# Patient Record
Sex: Female | Born: 1965 | Race: White | Hispanic: No | Marital: Married
Health system: Midwestern US, Academic
[De-identification: ages and names within clinical notes are randomized; demographics above are authoritative.]

---

## 2016-12-11 ENCOUNTER — Encounter: Admit: 2016-12-11 | Discharge: 2016-12-12 | Payer: BC Managed Care – PPO

## 2016-12-16 ENCOUNTER — Encounter: Admit: 2016-12-16 | Discharge: 2016-12-17 | Payer: BC Managed Care – PPO

## 2016-12-16 DIAGNOSIS — R69 Illness, unspecified: Principal | ICD-10-CM

## 2016-12-18 ENCOUNTER — Encounter: Admit: 2016-12-18 | Discharge: 2016-12-19 | Payer: BC Managed Care – PPO

## 2016-12-18 DIAGNOSIS — R69 Illness, unspecified: Principal | ICD-10-CM

## 2016-12-31 ENCOUNTER — Encounter: Admit: 2016-12-31 | Discharge: 2016-12-31 | Payer: BC Managed Care – PPO

## 2017-01-02 ENCOUNTER — Encounter: Admit: 2017-01-02 | Discharge: 2017-01-02 | Payer: BC Managed Care – PPO

## 2017-01-02 DIAGNOSIS — R69 Illness, unspecified: Principal | ICD-10-CM

## 2017-01-06 ENCOUNTER — Encounter: Admit: 2017-01-06 | Discharge: 2017-01-06 | Payer: BC Managed Care – PPO

## 2017-01-07 ENCOUNTER — Encounter: Admit: 2017-01-07 | Discharge: 2017-01-07 | Payer: BC Managed Care – PPO

## 2017-01-07 DIAGNOSIS — N6310 Unspecified lump in the right breast, unspecified quadrant: Principal | ICD-10-CM

## 2017-01-08 ENCOUNTER — Encounter: Admit: 2017-01-08 | Discharge: 2017-01-08 | Payer: BC Managed Care – PPO

## 2017-01-08 NOTE — Telephone Encounter
Navigation Intake Assessment    Patient Name:  Jacolyn Joaquin  DOB: 1965-08-19  Insurance:  Ezequiel Essex  Direct Referral: None  Appointment Info:    Future Appointments  Date Time Provider Department Center   01/12/2017 2:00 PM Dimas Alexandria, Cordelia Poche [place] [facility] Exam   01/12/2017 3:30 PM DIAGNOSTIC TOMO ROOM [facility] Radiology   01/12/2017 4:00 PM BREAST SONO ROOM 2 [facility] Radiology       Diagnosis & Reason for Visit:  Right breast mass, Pre-biopsy workup      Physician Info:   ? Referring Physician:  Rockwell Germany, PA  ??? Contact Name & Number:  Phone: [phone]  ??? Surgeon:  N/A  ??? Medical Oncologist:  N/A  ??? Radiation Oncologist:  N/A  ??? PCP:  Melissa Huntington, PA    Location of Films:   PACS    Location of Pathology:  No pathology slides requested.     History of Present Illness: Patient had a bilateral screening mammogram [facility]) on 12/11/16. Right breast focal asymmetry was discovered on imaging and additional imaging was recommended (BI-RADS 0). Right diagnostic mammogram [facility]) was done on 12/16/16 and persistent nodular fibroglandular tissue was seen on imaging (BI-RADS 0). Right breast ultrasound [facility]) on 12/16/16 revealed a vague masslike appearance (BI-RADS 4A). Patient had a right breast ultrasound-guided biopsy [facility]) on 12/18/16. Pathology revealed normal breast parenchyma and no definitive lesional tissue identified. No tissue was seen to explain the abnormal imaging findings. Clinical and radiologic correlation and follow-up were recommended. Patient was referred to [facility] for further evaluation and treatment. Patient denies pain, skin changes or nipple discharge on either breast. Patient denies any other history of breast issues, surgeries or biopsies. Outside images and reports were sent to [facility] Breast Radiology. Recommendations were given on 01/07/17 for patient to have a right diagnostic mammogram and right breast ultrasound. Patient was notified and scheduled.     Family History of Breast Cancer:   Patient denies a family history of breast, ovarian or prostate cancer.     Personal History of Other Cancers: None

## 2017-01-09 ENCOUNTER — Encounter: Admit: 2017-01-09 | Discharge: 2017-01-09 | Payer: BC Managed Care – PPO

## 2017-01-09 DIAGNOSIS — J45909 Unspecified asthma, uncomplicated: ICD-10-CM

## 2017-01-09 DIAGNOSIS — M1990 Unspecified osteoarthritis, unspecified site: ICD-10-CM

## 2017-01-09 DIAGNOSIS — F419 Anxiety disorder, unspecified: ICD-10-CM

## 2017-01-09 DIAGNOSIS — G43909 Migraine, unspecified, not intractable, without status migrainosus: Principal | ICD-10-CM

## 2017-01-09 DIAGNOSIS — D259 Leiomyoma of uterus, unspecified: ICD-10-CM

## 2017-01-09 DIAGNOSIS — K219 Gastro-esophageal reflux disease without esophagitis: ICD-10-CM

## 2017-01-12 ENCOUNTER — Encounter: Admit: 2017-01-12 | Discharge: 2017-01-12 | Payer: BC Managed Care – PPO

## 2017-01-12 ENCOUNTER — Encounter: Admit: 2017-01-12 | Discharge: 2017-01-13 | Payer: BC Managed Care – PPO

## 2017-01-12 DIAGNOSIS — J45909 Unspecified asthma, uncomplicated: ICD-10-CM

## 2017-01-12 DIAGNOSIS — G43909 Migraine, unspecified, not intractable, without status migrainosus: Principal | ICD-10-CM

## 2017-01-12 DIAGNOSIS — F419 Anxiety disorder, unspecified: ICD-10-CM

## 2017-01-12 DIAGNOSIS — N6310 Unspecified lump in the right breast, unspecified quadrant: Principal | ICD-10-CM

## 2017-01-12 DIAGNOSIS — D259 Leiomyoma of uterus, unspecified: ICD-10-CM

## 2017-01-12 DIAGNOSIS — K219 Gastro-esophageal reflux disease without esophagitis: ICD-10-CM

## 2017-01-12 DIAGNOSIS — M1990 Unspecified osteoarthritis, unspecified site: ICD-10-CM

## 2017-01-12 NOTE — Progress Notes
Name: Alicia Padilla          MRN: [id number]      DOB: 04/17/1966      AGE: 51 y.o.   DATE OF SERVICE: 01/12/2017               DIAGNOSIS:  Right breast mass     History of Present Illness    Alicia Padilla is a caucasian female who presented to the [facility] on 01/12/2017 at age 59 for evaluation of right breast mass. Alicia Padilla had no complaints prior to her screening mammogram. A new right breast asymmetry was seen. There was a probably correlate on ultrasound and biopsy was recommended. Right breast sono-guided biopsy 12/18/16 ([facility]) revealed benign breast tissue, no definitive lesional tissue was present. There was question as to if the area targeted for biopsy was sampled and Alicia Padilla was referred to [facility] for further evaluation.    BREAST IMAGING:  Mammogram:    -- Bilateral screening mammogram 12/11/16 [facility]) revealed scattered fibroglandular densities. There was a focal asymmetry in the right breast at 3:00 at posterior depth. No suspicious calcification in either breast. No asymmetry or mass in the left breast.  -- Right diagnostic mammogram 12/16/16 [facility]) revealed in the upper outer right breast there was a persistent nodular fibroglandular tissue with spot compression there was no evidence of suspicious calcifications.    Ultrasound:    -- Right breast ultrasound 12/16/16 [facility]) revealed in the area of mammographic concern at 9:00 there was an area of somewhat ill-defined heterogeneous echogenicity which had a vague masslike appearance. There was no definite evidence of axillary adenopathy. BI-RADS 4.    REPRODUCTIVE HEALTH:  Age at first Menarche:  50  Age at First Live Birth:  57  Age at Menopause:  Hysterectomy at 22, still has ovaries  Gravida:  3  Para: 3  Breastfeeding:  No    PERTINENT PMH:  Asthma  FAMILY HISTORY: No family history of breast, ovarian, or prostate cancer   PHYSICAL EXAM on PRESENTATION:  Right - No palpable breast masses. No skin, nipple, or areolar change. Left - No palpable breast masses. No skin, nipple, or areolar change. No supraclavicular or axillary adenopathy.  REFERRED BY:  Melissa Huntington, PA       Review of Systems    Constitutional: Negative for fever, chills, appetite change and fatigue.   HENT: Negative for hearing loss, congestion, rhinorrhea and tinnitus.    Eyes: Negative for pain, discharge and itching.   Respiratory: Negative for cough, chest tightness and shortness of breath.    Cardiovascular: Negative for chest pain and palpitations.   Gastrointestinal: Negative for abdominal distention, pain, vomiting. nausea and diarrhea.   Genitourinary: Negative for frequency, vaginal bleeding, difficulty urinating and pelvic pain.   Musculoskeletal: Negative for myalgias, back pain, joint swelling and arthralgias.   Skin: Negative for rash.   Neurological: Negative for dizziness, weakness, light-headedness. headaches.   Hematological: Does not bruise/bleed easily.   Psychiatric/Behavioral: Negative for disturbed wake/sleep cycle. The patient is not nervous/anxious.    No Known Allergies    The following medical/surgical/family/social history and the list of medications are current, as of 01/12/2017    Past Medical History:   Diagnosis Date   ??? Anxiety    ??? Asthma    ??? DJD (degenerative joint disease)    ??? GERD (gastroesophageal reflux disease)    ??? Migraines    ??? Uterine fibroid  Past Surgical History:   Procedure Laterality Date   ??? HYSTERECTOMY  08/2011   ??? TUBAL LIGATION     ??? WISDOM TEETH EXTRACTION       Family History   Problem Relation Age of Onset   ??? Cancer-Lung Mother    ??? Cancer Mother         brain   ??? Osteoporosis Mother    ??? Heart Attack Father         x 2   ??? Diabetes Father    ??? Cancer-Colon Paternal Grandfather    ??? Cancer Paternal Grandfather         stomach     Social History     Social History   ??? Marital status: Married     Spouse name: N/A   ??? Number of children: N/A   ??? Years of education: N/A Social History Main Topics   ??? Smoking status: Never Smoker   ??? Smokeless tobacco: Never Used   ??? Alcohol use Yes   ??? Drug use: No   ??? Sexual activity: Yes     Birth control/ protection: Surgical     Other Topics Concern   ??? Not on file     Social History Narrative   ??? No narrative on file         Objective:         ??? atorvastatin (LIPITOR) 10 mg tablet Take 10 mg by mouth daily.   ??? Azelastine 0.15 % (205.5 mcg) spry Apply  to each nostril as directed.   ??? hydroCHLOROthiazide (HYDRODIURIL) 50 mg tablet Take 50 mg by mouth every morning.   ??? omeprazole DR(+) (PRILOSEC) 20 mg capsule Take 20 mg by mouth daily before breakfast.   ??? SUMAtriptan succinate (IMITREX) 50 mg tablet Take 50 mg by mouth as Needed for Migraine symptoms. Dose may be repeated in 2 hours if needed. Max of 2 tablets in 24 hours.   ??? verapamil (ISOPTIN) 120 mg tablet Take 120 mg by mouth three times daily.     Vitals:    01/12/17 1402   BP: 130/90   Pulse: 87   Resp: 17   Temp: 36.8 ???C (98.3 ???F)   TempSrc: Oral   SpO2: 100%   Weight: 80.1 kg (176 lb 9.6 oz)   Height: 162.6 cm (64)     Body mass index is 30.31 kg/m???.     Pain Score: Zero         Pain Addressed:  N/A    Patient Evaluated for a Clinical Trial: No treatment clinical trial available for this patient.     [place] Cooperative Oncology Group performance status is Not applicable. Marland Kitchen     Physical Exam   Vitals reviewed.  RIGHT BREAST EXAM:  Breast:  No palpable masses  Skin Erythema:  No  Attachment of Overlying Skin:  No  Peau d' orange:  No  Chest Wall Attachment:  No  Nipple Inversion:  No  Nipple Discharge: No    LEFT BREAST EXAM:  Breast: No palpable masses  Skin Erythema:  No  Attachment of Overlying Skin:  No  Peau d' orange:  No  Chest Wall Attachment: No  Nipple Inversion:  No  Nipple Discharge:  No    RIGHT NODAL BASIN EXAM:  Axillary:  negative  Infraclavicular:  negative  Supraclavicular:  negative    LEFT NODAL BASIN EXAM:  Axillary:  negative  Infraclavicular: negative Supraclavicular:  negative    Constitutional: Well-developed  and well-nourished. No acute distress.  HEENT:  Head: Normocephalic and atraumatic.  Eyes: Pupils are equal, round and reactive to light. No discharge. No scleral icterus.  Cardiovascular: Normal rate, regular rhythm and normal heart sounds. No murmur or gallop.  Pulmonary/Chest: Effort normal and breath sounds normal. No respiratory distress. No wheezes. No rales.   Musculoskeletal: Normal range of motion. No edema.  Neurological: Alert and oriented to person, place and time. No cranial nerve deficit.  Skin: Warm and dry. No rash noted. No erythema. No pallor.  Psychiatric: Normal mood and affect. Behavior is normal. Judgement and thought content normal.              Assessment and Plan:  Right breast mass    Ms. Cloutier had an outside biopsy that was thought to be possibly discordant. She is scheduled for repeat imaging today. 3 outcomes from the repeat imaging 1) No further follow up imaging recommended, 2) Follow up imaging in 3-6 months, or 3) Biopsy. I will call Ms. Skora with her imaging results. She was given ample time to ask questions all of which were answered to her satisfaction.    1. Right diagnostic mammogram and ultrasound  2. RTC pending imaging    Dimas Alexandria, PA-C    ADDENDUM 01/12/17:  I spoke with Ms. Bendixen regarding her imaging (the mammogram was cancelled). 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 It was felt to be present on imaging since 2013. The pathology is felt to be concordant with the imaging. She may return to screening mammogram in June 2019.    US BREAST TARGET RT: RIGHT BREAST - January 12, 2017 -   ACCESSION #: [id number]  Technologist: Julianne Rice, Sonographer  Prior study comparison: December 18, 2016, mammogram. ???December 16, 2016,   mammogram. ???December 16, 2016, right breast ultrasound. ???December 11, 2016, mammogram. ???June 26, 2015, mammogram. ???March 17, 2014, mammogram. ???September 15, 2012, mammogram. ???June 20, 2011,   mammogram.    History: 51 year old female presents for second opinion of a   recently biopsied right breast mass    Findings: High-resolution targeted sonographic imaging was   performed of the right breast for further evaluation of a   recently biopsied right breast mass. At 9:00, 8 cm from the   nipple, there is no significant change in a circumscribed mass   which contains normal fat and fibroglandular breast tissue and   measures 4.8 x 2.0 cm. This corresponds to a mass with similar   features on mammography which has been stable since at least   06/20/2011, most consistent with a benign hamartoma. This   correlates with the pathologic findings of normal breast   parenchyma without definitive lesional tissue identified.    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 No additional targeted follow-up is required. This is felt   to be concordant with the pathology findings of benign normal   breast parenchyma. The patient should continue routine annual   screening mammograms.      Finalized by Damian Leavell, M.D. on 01/12/2017 3:52 PM. Dictated   by Damian Leavell, M.D. on 01/12/2017 3:47 PM.      Electronically signed and approved by: Damian Leavell, M.D.   [id number]  IMPRESSION    ACR BI-RADS??? Assessments: BIRAD 2-Benign        RECOMMENDATION:  Routine screening mammogram.  Meleana Commerford Miller, PA-C

## 2019-01-31 IMAGING — MG MAMMOGRAM 3D SCREEN, BILATERAL
12 series · 12 of 12 positions shown · non-contrast
Comparison: none

[R tomo (1 of 2)]
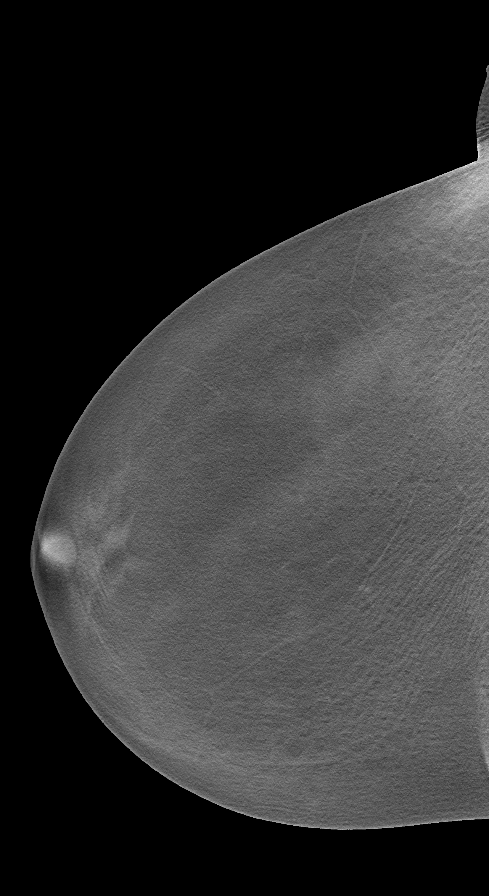

[R CC]
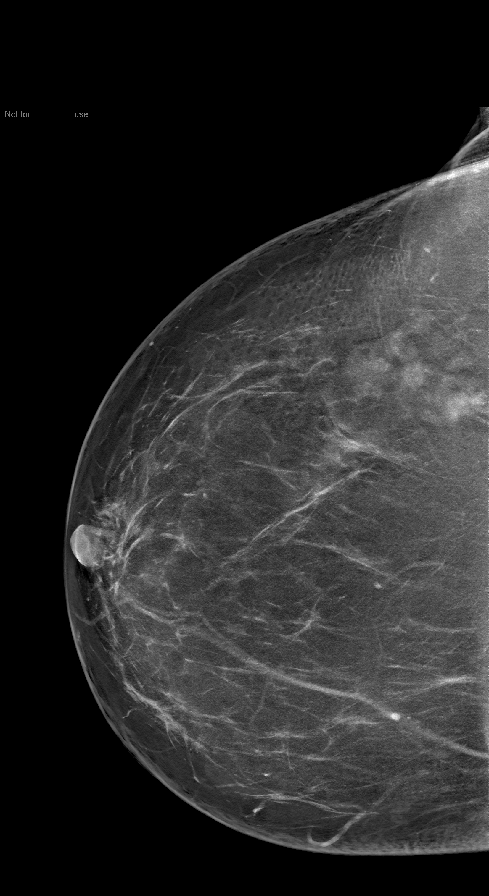

[R (1 of 2)]
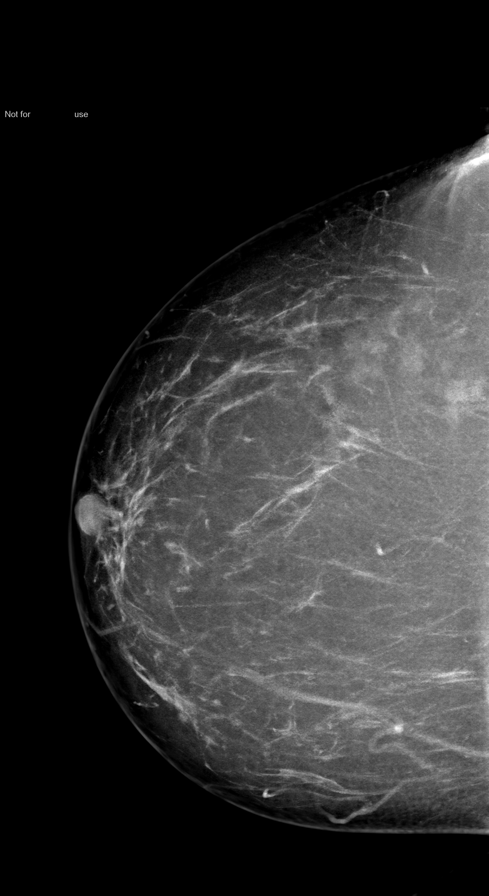

[L tomo (1 of 2)]
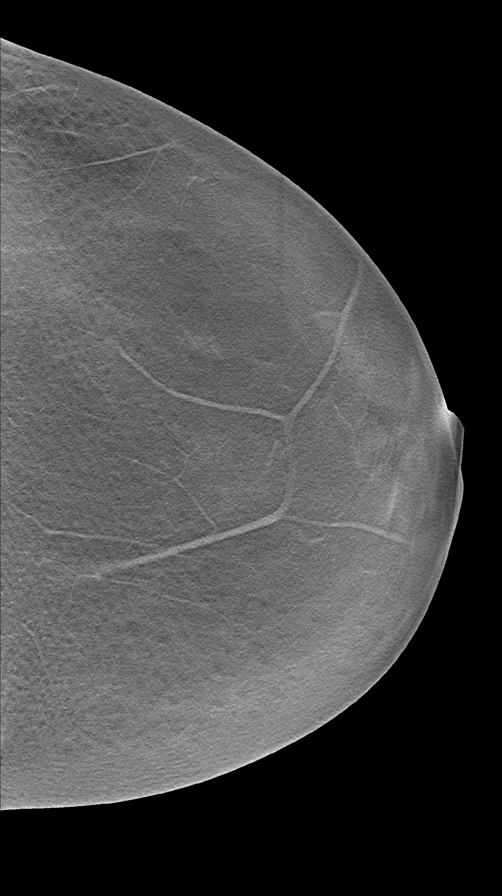

[L CC]
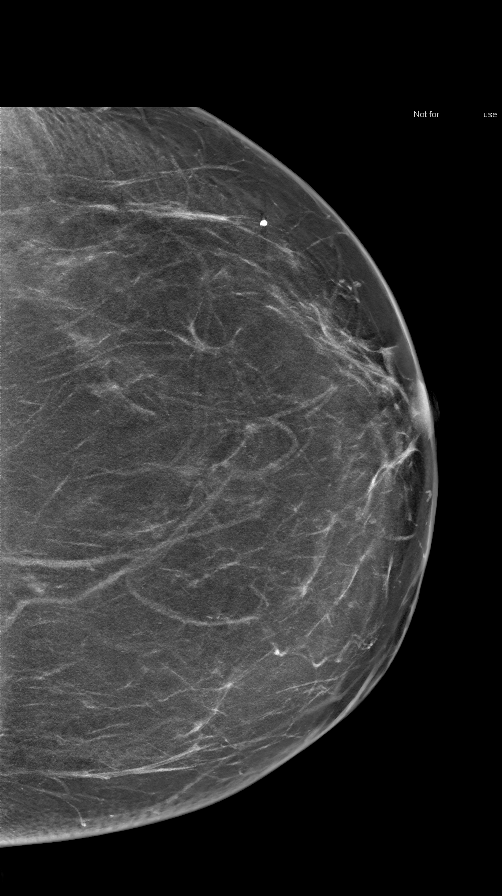

[L (1 of 2)]
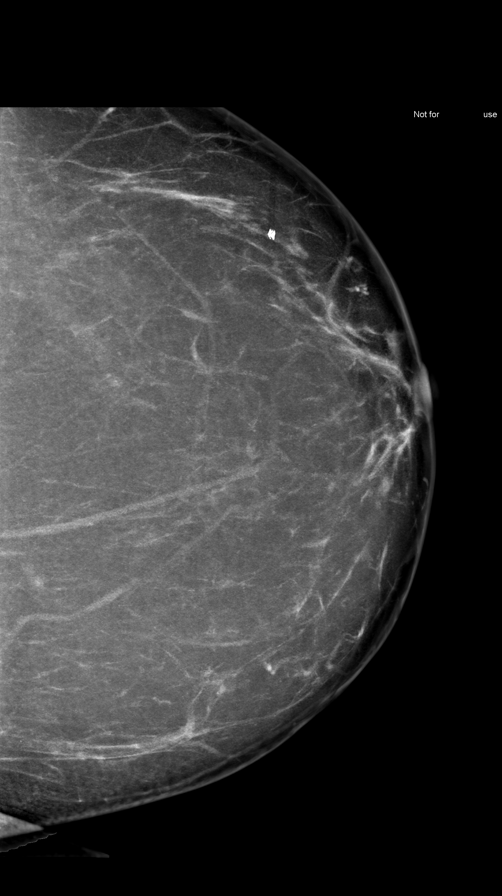

[R tomo (2 of 2)]
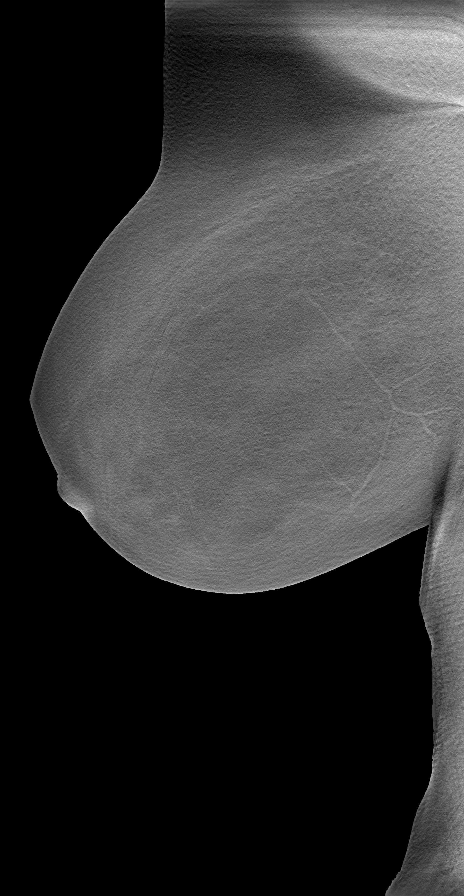

[R (2 of 2)]
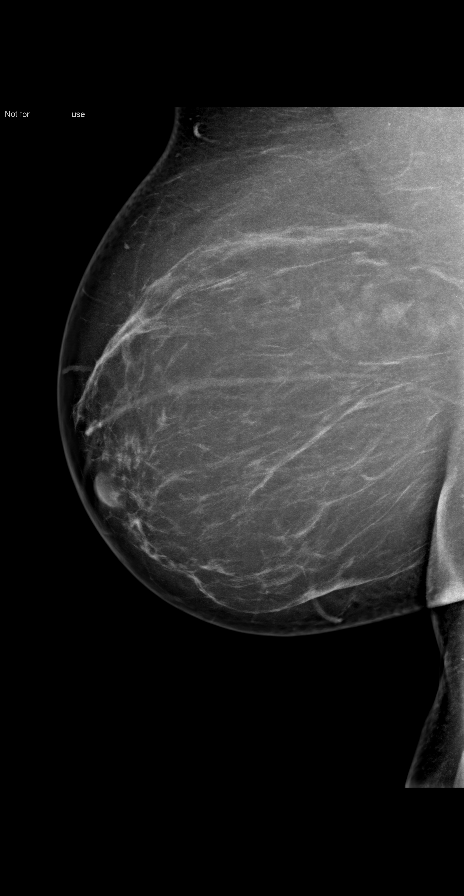

[L MLO (1 of 2)]
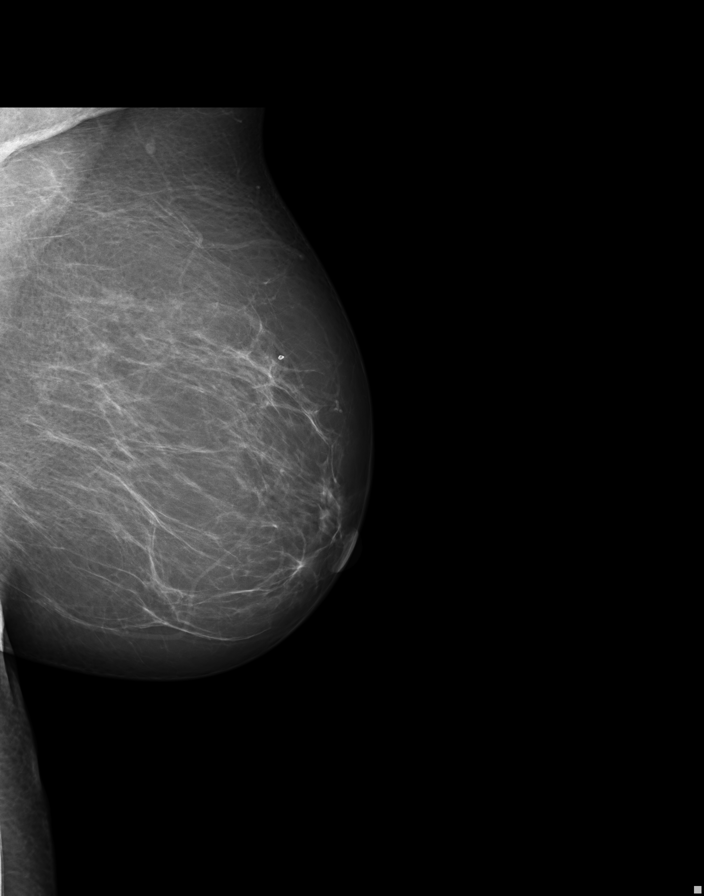

[L tomo (2 of 2)]
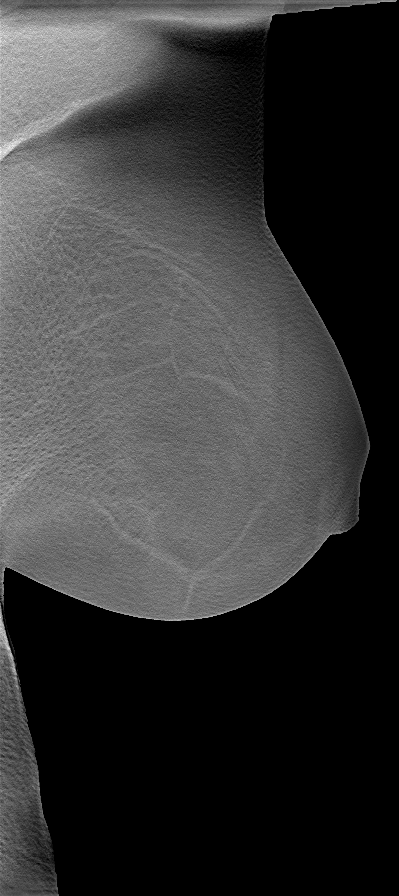

[L MLO (2 of 2)]
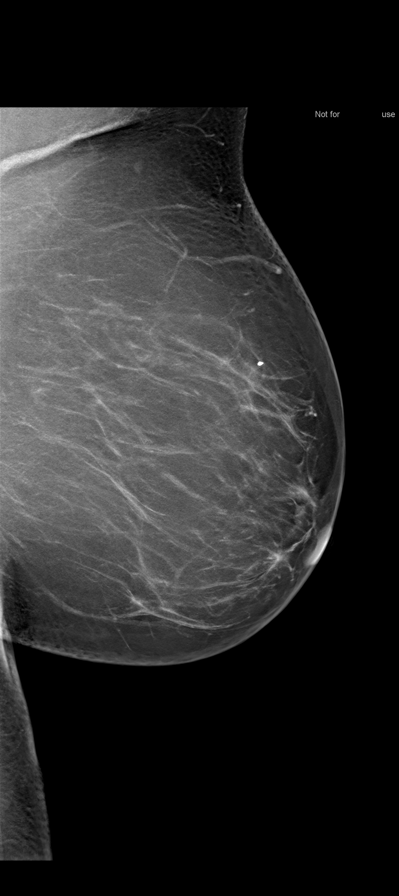

[L (2 of 2)]
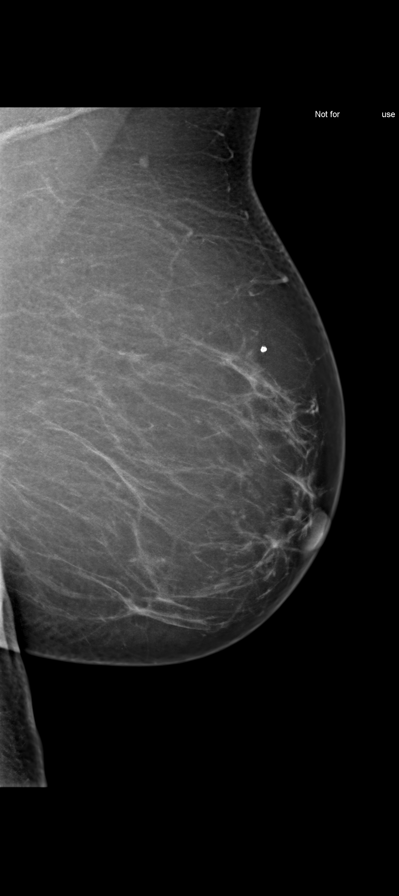

[12 of 12 positions shown; findings below may reference images not displayed]

DIAGNOSTIC STUDIES

EXAM

Screening mammogram

INDICATION

SCREENING
SCR 3D. LM

TECHNIQUE

Digital bilateral full field CC and MLO views of the breasts were obtained. Tomosynthesis was
utilized.

COMPARISONS

Screening mammogram 06/26/2015

FINDINGS

There are scattered areas of fibroglandular density. There is a focal asymmetry in the right
breast at 3 o'clock in the right breast at posterior depth. No suspicious calcification in either
breast. No asymmetry or mass in the left breast.

IMPRESSION

Focal asymmetry in the outer right breast at 3 o'clock, posterior depth. This may represent
asymmetric fibroglandular tissue. Recommend spot compression CC and MLO views of the right breast
to further evaluate.

BI-RADS 0-incomplete. Need additional imaging evaluation.

A results and recommendation letter will be sent to the patient.

## 2020-12-13 ENCOUNTER — Encounter: Admit: 2020-12-13 | Discharge: 2020-12-13 | Payer: BC Managed Care – PPO

## 2020-12-13 NOTE — Telephone Encounter
Received new referral via fax. Docs scanned in 06/30.

## 2020-12-14 ENCOUNTER — Encounter: Admit: 2020-12-14 | Discharge: 2020-12-14 | Payer: BC Managed Care – PPO

## 2021-04-18 ENCOUNTER — Encounter: Admit: 2021-04-18 | Discharge: 2021-04-18 | Payer: BC Managed Care – PPO

## 2021-04-24 ENCOUNTER — Ambulatory Visit: Admit: 2021-04-24 | Discharge: 2021-04-24 | Payer: BC Managed Care – PPO

## 2021-04-24 ENCOUNTER — Encounter: Admit: 2021-04-24 | Discharge: 2021-04-24 | Payer: BC Managed Care – PPO

## 2021-04-24 DIAGNOSIS — K760 Fatty (change of) liver, not elsewhere classified: Secondary | ICD-10-CM

## 2021-04-24 DIAGNOSIS — R945 Abnormal results of liver function studies: Secondary | ICD-10-CM

## 2021-04-24 DIAGNOSIS — M1990 Unspecified osteoarthritis, unspecified site: Secondary | ICD-10-CM

## 2021-04-24 DIAGNOSIS — K219 Gastro-esophageal reflux disease without esophagitis: Secondary | ICD-10-CM

## 2021-04-24 DIAGNOSIS — D259 Leiomyoma of uterus, unspecified: Secondary | ICD-10-CM

## 2021-04-24 DIAGNOSIS — G43909 Migraine, unspecified, not intractable, without status migrainosus: Secondary | ICD-10-CM

## 2021-04-24 DIAGNOSIS — J45909 Unspecified asthma, uncomplicated: Secondary | ICD-10-CM

## 2021-04-24 DIAGNOSIS — F419 Anxiety disorder, unspecified: Secondary | ICD-10-CM

## 2021-04-24 LAB — HEPATITIS A TOTAL AB (IGG+IGM)

## 2021-04-24 LAB — HEPATITIS B CORE AB TOT (IGG+IGM)

## 2021-04-24 LAB — CBC AND DIFF
ABSOLUTE BASO COUNT: 0.1 K/UL (ref 0–0.20)
ABSOLUTE EOS COUNT: 0.4 K/UL — ABNORMAL HIGH (ref 0–0.45)
ABSOLUTE LYMPH COUNT: 1.9 K/UL (ref 1.0–4.8)
ABSOLUTE MONO COUNT: 0.3 K/UL (ref 0–0.80)
ABSOLUTE NEUTROPHIL: 6.6 K/UL (ref 1.8–7.0)
BASOPHILS %: 1 % (ref 0–2)
HEMATOCRIT: 44 % — ABNORMAL HIGH (ref 36–45)
HEMOGLOBIN: 15 g/dL (ref 12.0–15.0)
LYMPHOCYTES %: 20 % — ABNORMAL LOW (ref 24–44)
MCH: 27 pg (ref 26–34)
MCHC: 34 g/dL — ABNORMAL HIGH (ref 32.0–36.0)
MCV: 81 FL (ref 80–100)
MONOCYTES %: 4 % — ABNORMAL HIGH (ref 4–12)
NEUTROPHILS %: 70 % (ref 41–77)
PLATELET COUNT: 319 K/UL — ABNORMAL HIGH (ref 150–400)
RBC COUNT: 5.4 M/UL — ABNORMAL HIGH (ref 4.0–5.0)
RDW: 14 % (ref 11–15)
WBC COUNT: 9.6 K/UL (ref 4.5–11.0)

## 2021-04-24 LAB — IMMUNOGLOBULIN G (IGG): IGG: 104 mg/dL (ref >60)

## 2021-04-24 LAB — HEMOGLOBIN A1C: HEMOGLOBIN A1C: 5 % (ref 4.0–6.0)

## 2021-04-24 LAB — PROTIME INR (PT)
INR: 1 (ref 0.8–1.2)
PROTIME: 11 s (ref 9.5–14.2)

## 2021-04-24 LAB — COMPREHENSIVE METABOLIC PANEL
POTASSIUM: 3.4 MMOL/L — ABNORMAL LOW (ref 3.5–5.1)
SODIUM: 140 MMOL/L (ref 137–147)

## 2021-04-24 LAB — IRON + BINDING CAPACITY + %SAT+ FERRITIN
FERRITIN: 275 ng/mL — ABNORMAL HIGH (ref 10–200)
IRON BINDING: 353 ug/dL (ref 270–380)
IRON: 107 ug/dL (ref 50–160)

## 2021-04-24 LAB — HEPATITIS B SURFACE AG

## 2021-04-24 LAB — IMMUNOGLOBULIN M (IGM): IGM: 85 mg/dL (ref 38–328)

## 2021-04-24 LAB — HEPATITIS B SURFACE AB: HEP B SURFACE ABY: POSITIVE — AB

## 2021-04-24 LAB — HEPATITIS C ANTIBODY W REFLEX HCV PCR QUANT

## 2021-04-24 NOTE — Progress Notes
Faxed Labs to [facility] [phone] per request.

## 2021-04-24 NOTE — Procedures
Fibroscan Procedure Note    Date:  @DATE @  Operator:  Phineas Semen, MD  Attending: Phineas Semen, MD    Observations  E (Elastacity) Median:  13.2  kPa  IQR / med.:  5.5%  CAP Median:  300 dB/m    Interpretation  Disease:  HBV/HCV/HCV-HIV/Cholestatic/NAFLD/Other  Fibrosis score correlation/Estimation:     HBV:    <6.0  F0-F1:  No to Minimal Fibrosis  6.0-8.9  F2:  Moderate Fibrosis  9.0-11.9 F3:  Advanced Fibrosis  >12.0  F4:  Cirrhosis    HCV  <7.0  F0-F1:  No to Minimal Fibrosis  7.0-9.4  F2:  Moderate Fibrosis  9.5-11.9 F3:  Advanced Fibrosis  >12  F4:  Cirrhosis    HCV-HIV  <7.0  F0-F1:  No to Minimal Fibrosis  7.0-10.9 (<10) F2:  Moderate Fibrosis  11.0-13.9 F3:  Advanced Fibrosis  >14  F4:  Cirrhosis    Cholestatic  <7.0  F0-F1:  No to Minimal Fibrosis  7.0-9.9 (>7.5) F2:  Moderate Fibrosis  10.0-16.9 F3:  Advanced Fibrosis  >17  F4:  Cirrhosis    NAFLD/NASH  <7.0  F0-F1:  No to Minimal Fibrosis  7.0-9.9 (>7.5) F2:  Moderate Fibrosis  10.0-13.9 F3:  Advanced Fibrosis  >14  F4:  Cirrhosis    Other  <6.0  F0-F1:  No to Minimal Fibrosis  6-8.9  F2:  Moderate Fibrosis  9.0-11.9 F3:  Advanced Fibrosis  >12  F4:  Cirrhosis      Steatosis score correlation/estimation:  238-258 S1: >11% Steatosis  259-289 S2: >34% Steatosis  >290  S3: >67% Steatosis      Recommendations  Obtain ELF      Fibroscan evaluations are estimates only and due to measurement variability/characteristics of elastography results should be interpreted with caution and clinical correlation is required.    I certify that I performed the interpretation of this study, including review of measurements and quality control measures.      Phineas Semen, MD

## 2021-05-06 ENCOUNTER — Encounter: Admit: 2021-05-06 | Discharge: 2021-05-06 | Payer: BC Managed Care – PPO

## 2021-05-06 DIAGNOSIS — R748 Abnormal levels of other serum enzymes: Secondary | ICD-10-CM

## 2021-05-07 ENCOUNTER — Encounter: Admit: 2021-05-07 | Discharge: 2021-05-07 | Payer: BC Managed Care – PPO

## 2021-05-07 DIAGNOSIS — F419 Anxiety disorder, unspecified: Secondary | ICD-10-CM

## 2021-05-07 DIAGNOSIS — D259 Leiomyoma of uterus, unspecified: Secondary | ICD-10-CM

## 2021-05-07 DIAGNOSIS — G43909 Migraine, unspecified, not intractable, without status migrainosus: Secondary | ICD-10-CM

## 2021-05-07 DIAGNOSIS — K219 Gastro-esophageal reflux disease without esophagitis: Secondary | ICD-10-CM

## 2021-05-07 DIAGNOSIS — M1990 Unspecified osteoarthritis, unspecified site: Secondary | ICD-10-CM

## 2021-05-07 DIAGNOSIS — J45909 Unspecified asthma, uncomplicated: Secondary | ICD-10-CM

## 2021-05-16 ENCOUNTER — Encounter: Admit: 2021-05-16 | Discharge: 2021-05-16 | Payer: BC Managed Care – PPO

## 2021-05-16 NOTE — Progress Notes
Interventional Radiology Outpatient Scheduling Checklist     1. Name of Procedure(s): Liver Biopsy     2. Date of Procedure: 05/22/21     3. Arrival Time: 0700     4. Procedure Time: 0800     5. Correct Procedural Room Assignment: [place]     6. Blood Thinners Triaged and instructed per protocol: Y/N/NA:  NA  Confirmed accurate instructions sent to patient: Y/N:  NA      7. Procedure Order Verified: Y/N:  Yes     9. Patient instructed to have a driver: Y/N/NA:  Yes    10. Patient instructed on NPO status: Y/N/NA:  Yes  Confirmed accurate instructions sent to patient: Y/N:  Yes    11. Specimen needed: Y/N/NA:  Yes   Verified Order placed: Y/N:  Yes    12. Allergies Verified: Y/N: Yes    13. Is there an Iodine Allergy: Y/N: No  Does the Procedure Require contrast: Y/N:  N   If so, was the IR- Contrast Allergy Pre-Procedure Medication protocol ordered: Y/NA:  NA    14. Does the patient have labs according to IR Pre-procedure Laboratory Parameter policy: Y/N/NA:  Yes  If No, was the patient instructed to obtain labs prior to procedure: Y/N/NA:  NA     15. Will the patient need to be admitted or have a possible admission: Y/N:  No  If yes, confirmed accurate instructions sent to patient: Y/N/NA:  NA     16. Patient States Understanding:Y/N:  Yes    17. History of OSA: Y/N:  No  If yes, confirm request to bring CPAP sent to patient: Y/N/NA:  NA    18. Does the patient have an insulin pump or continuous glucose monitor? NA               If yes, was the patient instructed that this will need to be removed for this procedure and to bring supplies to reapply once the procedure is complete? Y/N    19. Patient was sent electronic procedure instructions: Y/N:  Yes mychart.   Pt rescheduled d/t productive cough that started on 05/13/21. Covid test are negative.

## 2021-05-16 NOTE — Patient Education
Dear Ms. Alicia Padilla,    Thank you for choosing [facility] Interventional Radiology for your procedure. Your appointment information is listed below:    Appointment Date: 05/22/21  Appointment Time:  8am  Arrival Time: 7am  Location:     ? [facility]: [address], [place], [place] [address]   Parking: available in the front of the building          INTERVENTIONAL RADIOLOGY  PRE-PROCEDURE INSTRUCTIONS SEDATION    You are scheduled for a procedure in Interventional Radiology with procedural sedation.  Please follow these instructions and any direction from your Primary Care/Managing Physician.  If you have questions about your procedure or need to reschedule please call [phone].    Medication Instructions:   You may take the following medications with a small sip of water:  < ALL MEDICATIONS BEFORE 6am >       Diet Instructions:  a. (8) hours (12am) before your procedure, stop your regular diet and start a clear liquid diet.  b. (6) hours before your procedure, discontinue tube feedings and chewing tobacco.  c. (2) hours (6am) before your procedure discontinue clear liquids.  You should have nothing by mouth. This includes GUM or CANDY.       Clear Liquid Diet  Water                   Apple or White Grape Juice          Coffee or tea without cream   Tea                      White Cranberry Juice                   Chicken Bouillon or Broth (no noodles)  Soda Pop             Popsicles                                       Beef Bouillon or Broth (no noodles)      Day of Exam Instructions:  1. Bathe or shower with an antibacterial soap prior to your appointment.  2. If you have a history of Obstructive Sleep Apnea (OSA) bring your CPAP/BIPAP.   3. Bring a list of your current medications and the dosages.  4. Wear comfortable clothing and leave valuables at home.  5. Arrive (1) hour prior to your appointment.  This time will be spent registering, interviewing, assessing, educating and preparing you for the test.  ? You will be with [place] anywhere from 30 minutes to 6 hours after your exam depending on your procedure.  6. You may be sedated for the procedure. A responsible adult must drive you home (no Benedetto Goad, taxis or buses are allowed) and stay with you overnight. If you do not have a driver we will be unable to perform your procedure.   7. You will not be able to return to work or drive the same day if receiving sedation.

## 2021-05-22 ENCOUNTER — Ambulatory Visit: Admit: 2021-05-22 | Discharge: 2021-05-22 | Payer: BC Managed Care – PPO

## 2021-05-22 ENCOUNTER — Encounter: Admit: 2021-05-22 | Discharge: 2021-05-22 | Payer: BC Managed Care – PPO

## 2021-05-22 DIAGNOSIS — D259 Leiomyoma of uterus, unspecified: Secondary | ICD-10-CM

## 2021-05-22 DIAGNOSIS — R748 Abnormal levels of other serum enzymes: Secondary | ICD-10-CM

## 2021-05-22 DIAGNOSIS — J45909 Unspecified asthma, uncomplicated: Secondary | ICD-10-CM

## 2021-05-22 DIAGNOSIS — G43909 Migraine, unspecified, not intractable, without status migrainosus: Secondary | ICD-10-CM

## 2021-05-22 DIAGNOSIS — K219 Gastro-esophageal reflux disease without esophagitis: Secondary | ICD-10-CM

## 2021-05-22 DIAGNOSIS — F419 Anxiety disorder, unspecified: Secondary | ICD-10-CM

## 2021-05-22 DIAGNOSIS — M1990 Unspecified osteoarthritis, unspecified site: Secondary | ICD-10-CM

## 2021-05-22 MED ORDER — MIDAZOLAM 1 MG/ML IJ SOLN
0 refills | Status: CP
Start: 2021-05-22 — End: ?
  Administered 2021-05-22: 14:00:00 1 mg via INTRAVENOUS

## 2021-05-22 MED ORDER — FENTANYL CITRATE (PF) 50 MCG/ML IJ SOLN
0 refills | Status: CP
Start: 2021-05-22 — End: ?
  Administered 2021-05-22 (×2): 50 ug via INTRAVENOUS

## 2021-05-22 MED ORDER — KETOROLAC 30 MG/ML (1 ML) IJ SOLN
15 mg | Freq: Once | INTRAVENOUS | 0 refills | Status: CP
Start: 2021-05-22 — End: ?
  Administered 2021-05-22: 15:00:00 15 mg via INTRAVENOUS

## 2021-05-22 MED ORDER — MIDAZOLAM 1 MG/ML IJ SOLN
1 mg | Freq: Once | INTRAVENOUS | 0 refills | Status: CP
Start: 2021-05-22 — End: ?
  Administered 2021-05-22: 14:00:00 1 mg via INTRAVENOUS

## 2021-05-22 MED ORDER — SODIUM CHLORIDE 0.9 % IV SOLP
0 refills | Status: CP
Start: 2021-05-22 — End: ?
  Administered 2021-05-22: 14:00:00 500 mL via INTRAVENOUS

## 2021-05-22 MED ORDER — ONDANSETRON HCL (PF) 4 MG/2 ML IJ SOLN
4 mg | Freq: Once | INTRAVENOUS | 0 refills | Status: CP
Start: 2021-05-22 — End: ?
  Administered 2021-05-22: 14:00:00 4 mg via INTRAVENOUS

## 2021-05-22 NOTE — Procedures
Immediate Post Procedure Note    Date:  05/22/2021                                     Attending Physician:   Theda Sers  Assistant(s):  None    Procedure(s):  Liver biopsy  Preprocedure/postprocedure diagnosis:  S/p OLT and/or elevated LFT's  Description of procedure and procedural findings:  3 18 gauge cores taken of right hepatic lobe. Sent in formalin fixative to pathology. Gelfoam applied to needle tract to facilitate hemostasis  Anesthesia: Local 10 mL 1% lidocaine without epinephrine  Sedation/Medication Plan: Midazolam and Fentanyl    Time out performed: Consent obtained, correct patient verified, correct procedure verified, correct site verified, patient marked as necessary.  Estimated Blood Loss:  None/Negligible  Specimen(s) Removed/Disposition:  Yes, sent to pathology  Complications: None      Alicia Gilford, MD

## 2021-05-22 NOTE — H&P (View-Only)
IR History and Physical Summary Note      Name:Skarlette Claudette Wermuth                                                                   MRN: [id number]                 DOB:Nov 11, 1965          Age: 55 y.o.  Admission Date: 05/22/2021             Days Admitted: LOS: 0 days      Procedure Date: 05/22/2021     Planned Procedures:  Liver biopsy  Indications:  Abnormal LFTs    Allergies:  Valsartan    Lab/Other Diagnostic Tests:   Pertinent labs reviewed      History and Physical Update: I have examined the patient, and there are no significant changes in their condition, from the previous H&P performed on 04/24/21.    Previous Anesthetic/Sedation History: no adverse events    Airway:  unremarkable  Anesthesia Classification:  ASA III (A patient with a severe systemic disease that limits activity, but is not incapacitating)  NPO Status: Acceptable  Pregnancy Status: Not Pregnant  Sedation/Medication Plan:  Moderate sedation  Discussion/Reviews: Physician has discussed risks and alternatives of this type of sedation and above planned procedures with patient      [place] Joselito Fieldhouse, DO

## 2021-05-22 NOTE — Progress Notes
Sedation physician present in room.  Recent vitals and patient condition reviewed between sedating physician and nurse.  Reassessment completed.  Determination made to proceed with planned sedation.

## 2021-07-24 NOTE — Progress Notes
Date of Service: 07/26/2021     Subjective:            History of Present Illness       Alicia Padilla is a 56 y.o. 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 Other history significant for asthma, GERD, dyslipidemia, hypertension.     Please refer to additional hepatology documentation noted on 04/24/21 for additional pertinent history.    Briefly, patient was referred to hepatology clinic in 04/2021 for elevated liver enzymes and hyperbilirubinemia, suspected due to MAFLD in setting of alcohol use and poor diet/weight gain.     Interval Health  Since seen in clinic 04/24/21, patient reports doing fair and denies hospitalizations or ER visits.     Patient underwent biopsy on 05/22/21 showing steatohepatitis with steatotis (25%), ballooned hepatocyctes, and minimal lobular inflmmation (NAS 4/8). Mild, zone 3, perisonusoidal delicate fibrosis (Stage 1A/4).     She reports losing her niece and great nieces in a tragic house fire in January 2023 and she has not been coping with this well. She reports she eats as a form of coping with stress as well as boredom when she works from home. 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     She has weighed as little as 150lbs in the past, and was 195lbs today. She reports she does not like to cook, eats a lot of junk foods including chips and candy, and eats out a tleast 2-3x/week. She reports snacking all day long at her home office desk.     She denies exercising, though she has an elliptical and treadmill at home.     Patient denies any episodes of brain fog/confusion, abdominal pain, nausea/vomiting, blood in his stools,  Jaundice. She does have some lower extremity edema for which she wears compression socks.     She reports drinking 2-4 alcohol drinks/month. Denies tobacco and other substance use.       Recent Labs/imaging:   Pertinent labs were reviewed on initiation of note and below:    Lab Results   Component Value Date    TOTPROT 7.6 04/24/2021    ALBUMIN 4.7 04/24/2021    TOTBILI 1.4 (H) 04/24/2021    ALKPHOS 138 (H) 04/24/2021    AST 40 04/24/2021    ALT 82 (H) 04/24/2021    ALT 100 (H) 04/24/2021      Lab Results   Component Value Date    NA 140 04/24/2021    K 3.4 (L) 04/24/2021    BUN 19 04/24/2021    CR 1.03 (H) 04/24/2021      Lab Results   Component Value Date    WBC 9.6 04/24/2021    HGB 15.0 04/24/2021    PLTCT 319 04/24/2021    INR 1.0 04/24/2021        MELD-Na score: 8 at 04/24/2021 11:05 AM  MELD score: 8 at 04/24/2021 11:05 AM  Calculated from:  Serum Creatinine: 1.03 MG/DL at 65/12/8467 62:95 AM  Serum Sodium: 140 MMOL/L (Using max of 137 MMOL/L) at 04/24/2021 11:05 AM  Total Bilirubin: 1.4 MG/DL at 28/09/1322 40:10 AM  INR(ratio): 1.0 at 04/24/2021 11:05 AM  Age: 58 years           Patient denies other complaints or concerns at this time.         Review of Systems  A complete 10 point review of systems was negative except those listed in  the HPI.    Reviewed and updated active medication and allergy list.     ? albuterol sulfate (PROAIR HFA) 90 mcg/actuation HFA aerosol inhaler Inhale 2 puffs by mouth into the lungs every 6 hours as needed for Wheezing or Shortness of Breath. Shake well before use.   ? atorvastatin (LIPITOR) 10 mg tablet Take 10 mg by mouth daily.   ? Azelastine 0.15 % (205.5 mcg) spry Apply  to each nostril as directed.   ? cetirizine (ZYRTEC) 10 mg tablet Take 10 mg by mouth every morning.   ? cyclobenzaprine (FLEXERIL) 10 mg tablet Take 10 mg by mouth at bedtime as needed for Muscle Cramps.   ? furosemide (LASIX) 20 mg tablet Take 20 mg by mouth every morning.   ? levothyroxine (SYNTHROID) 75 mcg tablet Take 75 mcg by mouth daily 30 minutes before breakfast.   ? omeprazole DR(+) (PRILOSEC) 20 mg capsule Take 20 mg by mouth daily before breakfast.   ? SUMAtriptan succinate (IMITREX) 50 mg tablet Take 50 mg by mouth as Needed for Migraine symptoms. Dose may be repeated in 2 hours if needed. Max of 2 tablets in 24 hours.   ? TRIAMTERENE-HYDROCHLOROTHIAZID PO Take 75 mg by mouth. 75-50mg    ? verapamil (ISOPTIN) 120 mg tablet Take 120 mg by mouth three times daily.           Objective:           Vitals:    07/26/21 1011   BP: 129/84   BP Source: Arm, Right Upper   Pulse: 86   Temp: 36.4 ?C (97.5 ?F)   SpO2: 99%   TempSrc: Oral   PainSc: Zero   Weight: 88.5 kg (195 lb)   Height: 162.6 cm (5' 4)              MELD-Na score: 8 at 04/24/2021 11:05 AM  MELD score: 8 at 04/24/2021 11:05 AM  Calculated from:  Serum Creatinine: 1.03 MG/DL at 91/09/7827 56:21 AM  Serum Sodium: 140 MMOL/L (Using max of 137 MMOL/L) at 04/24/2021 11:05 AM  Total Bilirubin: 1.4 MG/DL at 30/01/6577 46:96 AM  INR(ratio): 1.0 at 04/24/2021 11:05 AM  Age: 70 years      Physical Exam    Physical Exam    Vitals reviewed.   Constitutional: Well-developed, well-nourished, patient is obese,  in no apparent distress.    HEENT: Normocephalic. no scleral icterus. Mask in place.  Cardiac:  Regular rate and rhythm.    Respiratory: Clear to auscultation bilaterally.  No wheezes or rales.  GI:  Abdomen soft, non-distended, non-tender. No shifting dullness. No overt hepatosplenomegaly.     Skin:  Skin is warm and dry. No rash noted.  No jaundice. no spider nevi noted.  no palmar erythema.  Peripheral Vascular: trace lower extremity edema.  Musculoskeletal:  ROM intact, no muscle wasting.   Psychiatric: Normal mood and affect. Behavior is normal.  Neuro:  no asterixis, no tremor.             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 Patient underwent biopsy on 05/22/21 showing steatohepatitis with steaotis (25%), ballooned hepatocyctes, and minimal lobular inflmmation (NAS 4/8). Mild, zone 3, perisonusoidal delicate fibrosis (Stage 1A/4).   ? We reviewed the role of uncontrolled metabolic risk factors (i.e. hypertension, hyperlipidemia, diabetes, and untreated obstructive sleep apnea) and the negative impact of these disease process with a concurrent diagnosis of NAFLD  ? We discussed  how fat deposits in the liver, how this leads to inflammation, and how chronic inflammation can ultimately lead to scarring and cirrhosis. The long-term complications of fatty liver disease were discussed with the patient, including variable progression to cirrhosis and disease decompensation.   ? Regarding the management of fatty liver disease, discussed an appropriate diet, exercise and weight loss plan. It is recommended to lose 5% body weight in order to reduce steatosis; and 10% is needed to reduce inflammation associated with NASH.  The patient should exercise regularly 3-4 times per week, with an average of 30-45 minutes per day.    ? We discussed the implementation of a healthier diet such as Mediterranean style diet that is moderately low in carbohydrates and consists of lean proteins (chicken, fish, [place]). We reviewed the recommendation to reduce the amounts of red meat consumption as it has shown higher rates of organ fat deposition.   ? Consider the utility of liberalizing coffee and black tea consumption (1-2 cups daily) as some data supports this may slow progression and reverse effects of NASH-related fibrosis.  ? CMP updated today to trend enzyme elevations.      Lipid Management/Screening:    ? Goal LDL in those with NALFD/NASH are < 100 mg/dL or 70mg /dL in individuals with known diabetes.  ? Patient is currently being treated with atorvastatin and last LDL was unknown.   ? Continue to follow with PCP for further management.     Dietary Counseling:    ? Discussed above noted recommendations and encouraged continued lifestyle modifications.   ? Discussed referral to  Weight Management Clinic. Patient is interested in the accountability this may help provide her as she moves towards her goals with weight loss and exercise. Referral placed.       Impaired Glucose Tolerance OR Diabetes Management/Screening:   ? Diabetes management is crucial with ideal goal Hgb A1c goal of =/< 7%.   ?  Last Hemoglobin A1C: 5.0 on 04/24/21  ? Continue to follow with PCP for further management.     Bone health:   ? All patients with liver disease are at an increased risk for osteopenia/osteoporosis.  We strongly recommend screening for Vitamin D deficiency with aggressive supplementation/replacement, as indicated.   ? Replacement per CFT protocol for values less than 30ng/mL.  No results found for: VITD25   ? Recommend calcium +Vitamin D supplementation (2000 IU of Vitamin D and 1200mg  Calcium).   ? Follow up DEXA scans to evaluate for improvement of bone density therapy encouraged.    Vaccinations:  ? Patient has immunity to hepatitis A and B.  ? Recommend patient receive annual flu vaccination and stay up-to-date on COVID vaccinations.    Health Maintenance & Cancer Screening:  ? Recommend staying up to date for cancer screening: mammograms/PAP for women, prostate cancer screening for men, colon cancer screening for all.  ? Patient reports having negative Colo guard screen in 2022.      Routine Health Care in Patient with Chronic Liver Disease:  ? All patients with liver disease should avoid the use of Non-steroidal Anti-Inflammatory (NSAID) medications as they can cause significant injury to the kidneys in this population.  ? The preferred analgesic in liver disease is Tylenol, up to 2g daily.  ? Recommended avoidance of herbal supplements and over the counter herbal remedies due to potential hepatic toxicities.        Follow Up:   1 year with Fibroscan in clinic or sooner if needed. The patient  is to call our office with any questions or changes to your health condition.     A total of 45 minutes was spent in the following activities: Preparing to see the patient, Obtaining and/or reviewing separately obtained history, Performing a medically appropriate examination and/or evaluation, Counseling and educating the patient/family/caregiver, Ordering medications, tests, or procedures, Documenting clinical information in the electronic or other health record and Independently interpreting results (not separately reported) and communicating results to the patient/family/caregiver.    Patient/patient family reports that all questions have been answered at this time.  No additional pending concerns.    Thank you very much for the opportunity to participate in the care of this patient.  If you have any further questions, please don't hesitate to contact our office.     _____________________________  Jolee Ewing, APRN, FNP-C  Hepatology & Liver Transplantation  [facility]  [phone]  Fax: [phone]

## 2021-07-26 ENCOUNTER — Ambulatory Visit: Admit: 2021-07-26 | Discharge: 2021-07-26 | Payer: BC Managed Care – PPO

## 2021-07-26 ENCOUNTER — Encounter: Admit: 2021-07-26 | Discharge: 2021-07-26 | Payer: BC Managed Care – PPO

## 2021-07-26 DIAGNOSIS — E669 Obesity, unspecified: Secondary | ICD-10-CM

## 2021-07-26 DIAGNOSIS — K760 Fatty (change of) liver, not elsewhere classified: Secondary | ICD-10-CM

## 2021-07-26 DIAGNOSIS — R945 Abnormal results of liver function studies: Secondary | ICD-10-CM

## 2021-07-26 DIAGNOSIS — Z713 Dietary counseling and surveillance: Secondary | ICD-10-CM

## 2021-07-26 DIAGNOSIS — K740 Hepatic fibrosis: Secondary | ICD-10-CM

## 2021-07-26 DIAGNOSIS — M1990 Unspecified osteoarthritis, unspecified site: Secondary | ICD-10-CM

## 2021-07-26 DIAGNOSIS — J45909 Unspecified asthma, uncomplicated: Secondary | ICD-10-CM

## 2021-07-26 DIAGNOSIS — G43909 Migraine, unspecified, not intractable, without status migrainosus: Secondary | ICD-10-CM

## 2021-07-26 DIAGNOSIS — D259 Leiomyoma of uterus, unspecified: Secondary | ICD-10-CM

## 2021-07-26 DIAGNOSIS — K219 Gastro-esophageal reflux disease without esophagitis: Secondary | ICD-10-CM

## 2021-07-26 DIAGNOSIS — F419 Anxiety disorder, unspecified: Secondary | ICD-10-CM

## 2021-07-26 LAB — COMPREHENSIVE METABOLIC PANEL
BLD UREA NITROGEN: 17 mg/dL (ref 7–25)
CALCIUM: 9.9 mg/dL (ref 8.5–10.6)
CHLORIDE: 101 MMOL/L (ref 98–110)
CREATININE: 0.9 mg/dL (ref 0.4–1.00)
GLUCOSE,PANEL: 98 mg/dL (ref 70–100)
POTASSIUM: 3.5 MMOL/L (ref 3.5–5.1)
SODIUM: 142 MMOL/L (ref 137–147)

## 2021-07-26 NOTE — Patient Instructions
Follow up to schedule:  1.) Return Visit with Dr. Shea Evans or B. Brooke Dare, NP in 1 year(s) fasting with Fibroscan.  2.) Labs today     Today in clinic with Chari Manning, Dr. Telford Nab nurse practitioner, we discussed the following:  Diagnosis  Fatty Liver     Your liver biopsy shows very little scarring and a moderate amount of fat. We want to decrease the fat in your liver to prevent scarring.  Fatty liver is common in the [place], approximately 1/3 of the population has fatty liver. Of the population that has fatty liver, 2% have fatty liver with fibrosis (stiffness of the liver). Fibrosis is a progressive disease that can eventually lead to cirrhosis of the liver. Fibrosis would require close monitoring and treatment.   The best management for fatty liver:  Try to lose 10-15 pounds in the next 6-12 months to help with the fatty liver deposits.  Work with your primary care provider to keep your blood cholesterol, blood sugar, and blood pressure under control.  We recommend 30-45 minutes of exercise each day. Even if you don't lose weight, it can help your fatty liver disease.  1-2 cups of black coffee per day has been proven to reduce liver inflammation.     Symptoms to watch for that indicate worsening liver disease:   Call our office if experiencing: abdominal swelling/distension, jaundice, fever, dark brown urine, 10pd weight gain/loss within 1 week, or swelling in legs.   GO STRAIGHT TO ER if experiencing: Vomiting blood or dark/tarry/bloody stools    Labs  Thank you for having labs drawn today. We will contact you with results.  We will repeat labs at next visit     Medications   Medication list was reviewed and updated as needed.     Nutrition  We recommend the Mediterranean Diet for liver health improvement.  www.skinnytaste.com is a great website for alternate recipes    Weight Management and Diabetes  Weight management and well-controlled diabetes are both very important in preventing progression of your liver diease. Obesity can deposit fat in your liver which can lead to scarring. High blood sugars can cause liver inflammation which can lead to scarring. Additional scarring can lead to irreversible symptoms and the need for liver transplant.     General Instructions:  How to reach [place]:  Please send a MyChart message to the Liver Treatment/Transplant Clinic or call [phone], option 2.  How to get a medication refill:  Please use the MyChart Refill request or contact your pharmacy directly to request medication refills. Please allow 48 hours.     This clinic does not prescribe pain medications.  If you have had labs or imaging outside the [facility] system and have not received results, contact us and let us know where they were completed.          Your care team:  Dr. Jearl Klinefelter, BSN, RN (Your Primary Nurse)    Bernell List, APRN  Jeralyn Bennett, BSN, RN     Phone: [phone], option 2  Fax: [phone]     Please ALWAYS let me know if you have been hospitalized or had labs/imaging done outside of [place] so I can promptly request these results.     Please contact our office for results if you have not been notified of your results within 7 business days of completion.

## 2021-10-15 ENCOUNTER — Encounter: Admit: 2021-10-15 | Discharge: 2021-10-15 | Payer: BC Managed Care – PPO

## 2021-10-15 DIAGNOSIS — M1990 Unspecified osteoarthritis, unspecified site: Secondary | ICD-10-CM

## 2021-10-15 DIAGNOSIS — E6609 Other obesity due to excess calories: Secondary | ICD-10-CM

## 2021-10-15 DIAGNOSIS — F419 Anxiety disorder, unspecified: Secondary | ICD-10-CM

## 2021-10-15 DIAGNOSIS — K219 Gastro-esophageal reflux disease without esophagitis: Secondary | ICD-10-CM

## 2021-10-15 DIAGNOSIS — K760 Fatty (change of) liver, not elsewhere classified: Secondary | ICD-10-CM

## 2021-10-15 DIAGNOSIS — J4520 Mild intermittent asthma, uncomplicated: Secondary | ICD-10-CM

## 2021-10-15 DIAGNOSIS — G43909 Migraine, unspecified, not intractable, without status migrainosus: Secondary | ICD-10-CM

## 2021-10-15 DIAGNOSIS — J45909 Unspecified asthma, uncomplicated: Secondary | ICD-10-CM

## 2021-10-15 DIAGNOSIS — D259 Leiomyoma of uterus, unspecified: Secondary | ICD-10-CM

## 2021-10-15 DIAGNOSIS — E039 Hypothyroidism, unspecified: Secondary | ICD-10-CM

## 2021-10-15 MED ORDER — OZEMPIC 0.25 MG OR 0.5 MG (2 MG/3 ML) SC PNIJ
1 refills | Status: AC
Start: 2021-10-15 — End: ?
  Filled 2021-10-16: qty 3, 42d supply, fill #1

## 2021-10-15 NOTE — Progress Notes
[facility]  Initial Visit    Date of Service: 10/15/2021  PCP: Rockwell Germany  DOB: February 13, 1966  MRN#: [id number]    Alicia Padilla is a 56 y.o. female.        History of Present Illness  Alicia Padilla is a 56 y.o. female with  has a past medical history of Anxiety, Asthma, DJD (degenerative joint disease), GERD (gastroesophageal reflux disease), Migraines, and Uterine fibroid. presenting for an initial discussion regarding obesity and weight management.    Weight history/ previous weight loss attempts:  She was very small growing up and all through school.     About 5 years ago she lost weight from 180-->150.  Used myfitness pal.  Made some nutritional adjustments (a meal replacement for supper after exercise), stopped soda, watched portions.  Also, doing a lot of cardio.    Eats when she is bored, stressed, or at work.  Snacks through the morning then has lunch.  She has been trying to not eat if she is not hungry.      She does not like to cook.  Used to go out to eat 3 nights per week.  If the option is there, she tries to choose a healthier option.     Has been through [employer] a number of times.  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      Her dad likes to eat all different things.  If she is home, she cooks for her dad and husband.  Her husband is  meat and potatoes guy.      No flowsheet data found.      No flowsheet data found.      24 dietary recall: **Her husband is in the hospital so she has been staying here and eating in cafeteria, not typical for her  Breakfast - 4:30am breakfast fruit bar.  Scrambled eggs, 2 pieces bacon, 1 biscuit with gravy  Snack - none  Lunch - 1/2 cheeseburger, grilled vegetables, 7 onion rings  Snack - none  Dinner - turtle brownie  Snack - hot chocolate with whipped cream    Activity: no regular activity    Overall Obesity Management Goals: Was told by the hepatology team that she needs to loose 10% TBW.  Would like to find a system that does not exacerbate her should and knee pain.  Would like to get off medications.      Past Medical History -  has a past medical history of Anxiety, Asthma, DJD (degenerative joint disease), GERD (gastroesophageal reflux disease), Migraines, and Uterine fibroid.     Past Surgical History -  has a past surgical history that includes hysterectomy (08/2011); wisdom teeth extraction; and tubal ligation.     Social history - Lots of stress right now, husband just had cardiac valve replacement and grandbaby is in the NICU in [place].  Works from home, [employer] (office work).  No tobacco.  Dad lives with her (age 16) and she helps to care for him too.  3 grown boys.      Family History   Problem Relation Age of Onset   ? Cancer-Lung Mother    ? Cancer Mother         brain   ? Osteoporosis Mother    ? Heart Attack Father         x 2   ? Diabetes Father    ? Cancer-Colon Paternal Grandfather    ?  Cancer Paternal Grandfather         stomach        Review of Systems   Constitution: Positive for weight loss.  Psyc: positive for family stress    Objective:     Physical Exam  Constitutional: Well developed, well nourished, no distress   HENT: Normocephalic, atraumatic  Respiratory: Normal effort, no respiratory distress  Neurologic: Alert  Psych: mood /affect normal. Behavior normal. Thought content normal. Judgement normal.    Medications-  ? albuterol sulfate (PROAIR HFA) 90 mcg/actuation HFA aerosol inhaler Inhale two puffs by mouth into the lungs every 6 hours as needed for Wheezing or Shortness of Breath. Shake well before use.   ? atorvastatin (LIPITOR) 10 mg tablet Take one tablet by mouth daily.   ? Azelastine 0.15 % (205.5 mcg) spry Apply  to each nostril as directed.   ? cetirizine (ZYRTEC) 10 mg tablet Take one tablet by mouth every morning.   ? cyclobenzaprine (FLEXERIL) 10 mg tablet Take one tablet by mouth at bedtime as needed for Muscle Cramps.   ? furosemide (LASIX) 20 mg tablet Take one tablet by mouth every morning.   ? levothyroxine (SYNTHROID) 75 mcg tablet Take one tablet by mouth daily 30 minutes before breakfast.   ? omeprazole DR(+) (PRILOSEC) 20 mg capsule Take one capsule by mouth daily before breakfast.   ? SUMAtriptan succinate (IMITREX) 50 mg tablet Take one tablet by mouth as Needed for Migraine symptoms. Dose may be repeated in 2 hours if needed. Max of 2 tablets in 24 hours.   ? TRIAMTERENE-HYDROCHLOROTHIAZID PO Take 75 mg by mouth. 75-50mg      Recent Labs:  Basic Metabolic Profile    Lab Results   Component Value Date/Time    NA 142 07/26/2021 11:22 AM    K 3.5 07/26/2021 11:22 AM    CA 9.9 07/26/2021 11:22 AM    CL 101 07/26/2021 11:22 AM    CO2 29 07/26/2021 11:22 AM    GAP 12 07/26/2021 11:22 AM    Lab Results   Component Value Date/Time    BUN 17 07/26/2021 11:22 AM    CR 0.97 07/26/2021 11:22 AM    GLU 98 07/26/2021 11:22 AM        Hepatic Function    Lab Results   Component Value Date/Time    ALBUMIN 4.6 07/26/2021 11:22 AM    TOTPROT 7.2 07/26/2021 11:22 AM    ALKPHOS 111 (H) 07/26/2021 11:22 AM    Lab Results   Component Value Date/Time    AST 41 (H) 07/26/2021 11:22 AM    ALT 65 (H) 07/26/2021 11:22 AM    TOTBILI 1.1 07/26/2021 11:22 AM        TSH   No results found for: TSH   Lab Draw:  Lab Results   Component Value Date/Time    HGBA1C 5.0 04/24/2021 11:05 AM     POC:  No results found for: A1C   No results found for: CHOL, TRIG, HDL, LDL, VLDL, NONHDLCHOL, CHOLHDLC   Wt Readings from Last 5 Encounters:   10/15/21 86 kg (189 lb 9.6 oz)   07/26/21 88.5 kg (195 lb)   05/22/21 81.6 kg (180 lb)   04/24/21 86.5 kg (190 lb 12.8 oz)   01/12/17 80.1 kg (176 lb 9.6 oz)     BP Readings from Last 3 Encounters:   10/15/21 123/87   07/26/21 129/84   05/22/21 (!) 141/85     Vitals:  10/15/21 1003   BP: 123/87   BP Source: Arm, Right Upper   Pulse: 92   Resp: 18   PainSc: Zero   Weight: 86 kg (189 lb 9.6 oz)   Height: 162.6 cm (5' 4)      Body mass index is 32.54 kg/m?Marland Kitchen       Assessment and Plan:  Obesity-   Initial weight & BMI:    Wt Readings from Last 1 Encounters:   10/15/21 86 kg (189 lb 9.6 oz)   Body mass index is 32.54 kg/m?.  10/15/21    Treatment:  Plan/ level of monitoring: Nutrition Counseling and Pharmacotherapy  Nutrition Goals -   1. Diet soda - totally fine!!  However, it makes you eat more.  If you choose a soda have this AS a snack, not WITH a snack.  If you choose to eat, drink water.    2. Choose a high protein breakfast - this has 30gm of protein and <5gm sat fat.  Look at your options for breakfast sandwiches or a protein shake (fairlife of premier protein).    3. If you are only in charge of you for a meal, consider using a portion controlled meal.      Activity Goals - not discussed in detail    Medication Recommendations-   4. Ozempic - this is very helpful for metabolic liver disease.  Sometimes insurance will cover this and sometimes it wont.  We'll try it.    5. If this is not covered, we'll use a combination of medications called topamax and phentermine.  This will help you to feel less hungry (especially during the day) and feel more controlled with your nutrition choices.      Discussion of safety/side effects - discussed n/v/constipation, pancreatitis, thyroid ca    Obesity Management goals: Was told by the hepatology team that she needs to loose 10% TBW.  Would like to find a system that does not exacerbate her should and knee pain.  Would like to get off medications.      Further obesity management recommendations:   ? Annual screen for a assessment of Type II diabetes, HTN, Hyperlipidemia, Depression, OSA, NAFLD, Vitamin D deficiency and renal disease.   ? Given the association of obesity with cancer, all age appropriate cancer screenings should be UTD  ? Medication Review: If possible medication that promote weight gain should be avoided and medication that are weight-neutral or promote weight loss should be considered.     Comorbid Conditions:  HTN  HLD  Hypothyroidism  MAFLD/ NASH  Partial rotator cuff tear  Asthma    Total Time Today was 45 minutes in the following activities: Preparing to see the patient, Obtaining and/or reviewing separately obtained history, Performing a medically appropriate examination and/or evaluation, Counseling and educating the patient/family/caregiver, Ordering medications, tests, or procedures, Referring and communication with other health care professionals (when not separately reported) and Independently interpreting results (not separately reported) and communicating results to the patient/family/caregiver      Future Appointments   Date Time Provider Department Center   07/25/2022 10:30 AM Windy Canny, APRN-NP CFT [facility]P [facility] [place]

## 2021-10-16 ENCOUNTER — Encounter: Admit: 2021-10-16 | Discharge: 2021-10-16 | Payer: BC Managed Care – PPO

## 2021-10-16 NOTE — Progress Notes
No prior authorization was required for Ozempic for Alicia Padilla. The copay is $86.67/first fill. Patient may see a difference in cost next refill, as they are paying toward their deductible.     The medication will be delivered to patient's prescription address per the patient's request..    [facility]  [phone]

## 2021-10-24 ENCOUNTER — Encounter: Admit: 2021-10-24 | Discharge: 2021-10-24 | Payer: BC Managed Care – PPO

## 2021-10-24 DIAGNOSIS — E669 Obesity, unspecified: Secondary | ICD-10-CM

## 2021-10-24 NOTE — Patient Instructions
1. Increase self-awareness and accountability through self-monitoring.    - Follow up with RD for support     2. Consume a minimum of 5 cups of fruits/vegetables daily.   Focus on getting at least half of foods as fruits and non-starchy vegetables. Whole grains, lean meats/eggs, some dairy, nuts and seeds made up the remainder of the diet. Foods both high in calories and low in nutritive quality are consumed rarely (baked goods, fried foods, high fat/processed meats, sweets).    - balanced snacks (pick 2-3 options to have for the week) -- see Snack Planning Tool     3. Progress to meet recommendation for 150+ minutes of physical activity for general health, while increasing to 300+ minutes weekly for continued weight loss or increased success for weight maintenance.   - walking or elliptical 2-3x/week for 10-15 minutes    4. Increase activities of daily living and reduce sedentary time.    Step goal: 7,000 steps/day  - walk to mailbox, use bathroom farther away, take stairs, park in back of parking lot    5. Make modifications to environment to support healthy lifestyle changes.   - Diabetes Food Hub - [employer].html  - portion snacks into bowl, leave package in kitchen

## 2021-10-24 NOTE — Progress Notes
Primary goal of visit: Initial nutrition counseling visit for Intensive Behavioral Therapy for Weight Management    Nutrition Assessment of Patient:    Ht Readings from Last 1 Encounters:   10/15/21 162.6 cm (5' 4)       Wt Readings from Last 5 Encounters:   10/24/21 85.7 kg (189 lb)   10/15/21 86 kg (189 lb 9.6 oz)   07/26/21 88.5 kg (195 lb)   05/22/21 81.6 kg (180 lb)   04/24/21 86.5 kg (190 lb 12.8 oz)    *pt reported weight    BMI: Body mass index is 32.44 kg/m?Marland Kitchen  BMI Categories Adult: class I obese  Total Energy Expenditure (based on Mifflin equation x activity factor): 1450 x 1.2 = 1750 kcal  Total Calorie Prescription for weight loss: 1250 kcal  Needs to promote: weight loss and maintenance  6 months: 04/26/22    Notes:     Goals - lose wt, get in shape, maintain changes, improve health, reduce medication usage.     Previous diets - [employer], wonder, calorie counting on MFP    Feels she knows what to do, just needs to apply.     Father has DM; husband has DM + heart dz.     Will discuss in future:  *healthier BF sandwiches / casseroles / bites  *planning    Diet:      Meal 1 Snack Meal 2 Snack Meal 3 Snack   Time 8-9 am         Eggs (1-2) + onions + toast (whole wheat) + butter    Might make her own sandwich - cheese + onoins + mushrooms     Wheat bagel + cream cheese     BF sandwiches (Jimmy Dean - bacon, croissant) [employer] Leftovers or frozen meal ([employer])      Hot dog + chips [employer] Sometimes just has Smoothie (Premier protein) + milk + berries      Grilled chicken + mashed potatoes or salad      Hamburgers/brats      Tator tot casserole     More beef - half a cow in freezer    Drinks: 1-2 diet dr. Alcus Dad per day; hot tea + honey; water (24-64 oz); etoh rare (1-2 beers)  Snacks: hot tamales; likes crunch of chips, peppered popcorn; likes salty things  Fruits/Vegetables: likes these, buys them but doesn't always eat them; 0-1/day  Meal Plan/Prep: she does the cooking but doesn't like to cook; in the past did menu planning; likes casseroles; has air fryer + instapot, black stone, grill; husband helps with grilling; day by day; does eat some leftovers  Eating Out: 3-x/week; out to dinner or pick up; may only eat 1/2 of meal    - trying to get veg over fries lately  Weekends: less snacking unless bored   Tracking: in past on MFP, none right now    Activity:  Planned Exercise: has treadmill + elliptical; prefers elliptical  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 Increase self-awareness and accountability through self-monitoring.    - Follow up with RD for support     2. Consume a minimum of 5 cups of fruits/vegetables daily.   Focus on getting at least half of foods as fruits and non-starchy vegetables. Whole grains, lean meats/eggs, some dairy, nuts and seeds made up the remainder of the diet. Foods both high in calories and low in nutritive quality are consumed rarely (baked goods, fried foods, high fat/processed meats, sweets).    - balanced snacks (pick 2-3 options to have for the week) -- see Snack Planning Tool     3. Progress to meet recommendation for 150+ minutes of physical activity for general health, while increasing to 300+ minutes weekly for continued weight loss or increased success for weight maintenance.   - walking or elliptical 2-3x/week for 10-15 minutes    4. Increase activities of daily living and reduce sedentary time.    Step goal: 7,000 steps/day  - walk to mailbox, use bathroom farther away, take stairs, park in back of parking lot    5. Make modifications to environment to support healthy lifestyle changes.   - Diabetes Food Hub - [email or web address]html  - portion snacks into bowl, leave package in kitchen      Nutrition Monitoring and Evaluation:     Time spent with patient: 40 minutes    Follow up in 3 week(s)    Contact information provided for questions     Thank you,    Claiborne Rigg, RD LD

## 2021-11-14 ENCOUNTER — Encounter: Admit: 2021-11-14 | Discharge: 2021-11-14 | Payer: BC Managed Care – PPO

## 2021-11-14 DIAGNOSIS — E669 Obesity, unspecified: Secondary | ICD-10-CM

## 2021-11-14 NOTE — Patient Instructions
1. Increase self-awareness and accountability through self-monitoring.    - Follow up with RD for support   - journal food + workouts in your journal -- include time, amount, detail + mood + hunger level     2. Consume a minimum of 5 cups of fruits/vegetables daily.   Focus on getting at least half of foods as fruits and non-starchy vegetables. Whole grains, lean meats/eggs, some dairy, nuts and seeds made up the remainder of the diet. Foods both high in calories and low in nutritive quality are consumed rarely (baked goods, fried foods, high fat/processed meats, sweets).     - balanced snacks (pick 2-3 options to have for the week) -- see Snack Planning Tool      3. Progress to meet recommendation for 150+ minutes of physical activity for general health, while increasing to 300+ minutes weekly for continued weight loss or increased success for weight maintenance.   - walking or elliptical 2-3x/week for 10-15 minutes     4. Increase activities of daily living and reduce sedentary time.    Step goal: 7,000 steps/day  - walk to mailbox, use bathroom farther away, take stairs, park in back of parking lot     5. Make modifications to environment to support healthy lifestyle changes.   - portion snacks into bowl, leave package in kitchen  - tips to slow down while eating - set fork/food down between bites, take smaller bites, chew more per bite (20-30 times), drink some water between bites  - keep in mind that hunger & fullness scale when eating!

## 2021-11-14 NOTE — Progress Notes
Primary goal of visit: Follow-up nutrition counseling visit for Intensive Behavioral Therapy for Weight Management    Nutrition Assessment of Patient:    Ht Readings from Last 1 Encounters:   10/15/21 162.6 cm (5' 4)       Wt Readings from Last 5 Encounters:   11/14/21 83.9 kg (185 lb)   10/24/21 85.7 kg (189 lb)   10/15/21 86 kg (189 lb 9.6 oz)   07/26/21 88.5 kg (195 lb)   05/22/21 81.6 kg (180 lb)    *pt reported weight    Weight change: Lost 4 lbs since initial weight of 189 lbs on 10/24/21     Body mass index is 31.76 kg/m?Marland Kitchen  BMI Categories Adult: class I obese  Total Calorie Prescription: 1250 kcal   Needs to promote: weight loss and maintenance  6 months: 04/26/22    Notes:     Got a journal for PA + food. No PA yet tho.    Last night went out - got asparagus instead of fries with her chicken strips. States she wasn't even hungry but still ate all of her chicken strips.   Reports she is a fast eater.   Snacking is still greatest challenge. Some of the time portioned into bowl instead of bringing the bag downstairs.   Got some hummus to have with crackers.     Meal 1 Snack Meal 2 Snack Meal 3 Snack   Time     6 pm     2 frozen BF burritos Hummus + crackers 2 beer brats on buns (although she was satisfied + full after just 1)  Few chips + queso + chicken strips + asparagus        Intervention/Plan:     1. Increase self-awareness and accountability through self-monitoring. ?  - Follow up with RD for support   - journal food + workouts in your journal -- include time, amount, detail + mood + hunger level  ?  2. Consume a minimum of 5 cups of fruits/vegetables daily.   Focus on getting at least half of foods as fruits and non-starchy vegetables. Whole grains, lean meats/eggs, some dairy, nuts and seeds made up the remainder of the diet. Foods both high in calories and low in nutritive quality are consumed rarely (baked goods, fried foods, high fat/processed meats, sweets).  ?  - balanced snacks (pick 2-3 options to have for the week) -- see Snack Planning Tool   ?  3. Progress to meet recommendation for 150+ minutes of physical activity for general health, while increasing to 300+ minutes weekly for continued weight loss or increased success for weight maintenance.   - walking or elliptical 2-3x/week for 10-15 minutes  ?  4. Increase activities of daily living and reduce sedentary time. ?  Step goal: 7,000 steps/day  - walk to mailbox, use bathroom farther away, take stairs, park in back of parking lot  ?  5. Make modifications to environment to support healthy lifestyle changes.   - portion snacks into bowl, leave package in kitchen  - tips to slow down while eating - set fork/food down between bites, take smaller bites, chew more per bite (20-30 times), drink some water between bites  - keep in mind that hunger & fullness scale when eating!    Nutrition Monitoring and Evaluation:     Time spent with patient: 20 minutes    Follow up in 3 week(s)    Contact information provided for questions  Thank you,    Claiborne Rigg, RD LD

## 2021-11-29 ENCOUNTER — Encounter: Admit: 2021-11-29 | Discharge: 2021-11-29 | Payer: BC Managed Care – PPO

## 2021-11-30 ENCOUNTER — Encounter: Admit: 2021-11-30 | Discharge: 2021-11-30 | Payer: BC Managed Care – PPO

## 2021-12-01 ENCOUNTER — Encounter: Admit: 2021-12-01 | Discharge: 2021-12-01 | Payer: BC Managed Care – PPO

## 2021-12-02 ENCOUNTER — Encounter: Admit: 2021-12-02 | Discharge: 2021-12-02 | Payer: BC Managed Care – PPO

## 2021-12-03 ENCOUNTER — Encounter: Admit: 2021-12-03 | Discharge: 2021-12-03 | Payer: BC Managed Care – PPO

## 2021-12-03 MED FILL — OZEMPIC 0.25 MG OR 0.5 MG (2 MG/3 ML) SC PNIJ: 0.25 mg or 0.5 mg (2 mg/3 mL) | SUBCUTANEOUS | 28 days supply | Qty: 3 | Fill #2 | Status: AC

## 2021-12-05 ENCOUNTER — Encounter: Admit: 2021-12-05 | Discharge: 2021-12-05 | Payer: BC Managed Care – PPO

## 2021-12-05 DIAGNOSIS — E669 Obesity, unspecified: Secondary | ICD-10-CM

## 2021-12-05 NOTE — Progress Notes
Primary goal of visit: Follow-up nutrition counseling visit for Intensive Behavioral Therapy for Weight Management    Nutrition Assessment of Patient:    Ht Readings from Last 1 Encounters:   10/15/21 162.6 cm (5' 4)       Wt Readings from Last 5 Encounters:   12/05/21 82.5 kg (181 lb 14.4 oz)   11/14/21 83.9 kg (185 lb)   10/24/21 85.7 kg (189 lb)   10/15/21 86 kg (189 lb 9.6 oz)   07/26/21 88.5 kg (195 lb)    *pt reported weight    Weight change: Lost 8 lbs since initial weight of 189 lbs on 10/24/21     Body mass index is 31.22 kg/m?Marland Kitchen  BMI Categories Adult: class I obese  Total Calorie Prescription: 1250 kcal?  Needs to promote: weight loss and maintenance  6 months: 04/26/22    Notes:     Feels she is eating less. Not really ever hungry. Less snacking.   Still eating B/L/D.   Not as hungry on Ozempic. Keeping less desirable snacks out of house.   Clothes are fitting better.   F/v - tries to do 1-2 veg with dinner, cut down eating fruit in morning. May add avocado to eggs 1-2x/week.      Meal 1 Snack Meal 2 Snack Meal 3 Snack   Time 8-10 am  1-2:30 pm           Hot dog or hamburger, no sides    At ball game.      Chips or popcorn        Intervention/Plan:     1. Increase self-awareness and accountability through self-monitoring.??  -?Follow up with RD for support?  - journal food + workouts in your journal -- include time, amount, detail + mood + hunger level  ?  2. Consume a minimum of 5 cups of fruits/vegetables daily.   Focus on getting at least half of foods as fruits and non-starchy vegetables. Whole grains, lean meats/eggs, some dairy, nuts and seeds made up the remainder of the diet. Foods both high in calories and low in nutritive quality are consumed rarely (baked goods, fried foods, high fat/processed meats, sweets).  ?  - balanced snacks (pick 2-3 options to have for the week) -- see Snack Planning Tool?  - include a fruit or vegetable with breakfast  ?  3. Progress to meet recommendation for 150+ minutes of physical activity for general health, while increasing to 300+ minutes weekly for continued weight loss or increased success for weight maintenance.?  - walking or elliptical 2-3x/week for 10-15 minutes  ?  4. Increase activities of daily living and reduce sedentary time.??  Step goal:?7,000?steps/day  -?walk to mailbox, use bathroom farther away, take stairs, park in back of parking lot  ?  5. Make modifications to environment to support healthy lifestyle changes.?  - keep in mind the non scale victories     Nutrition Monitoring and Evaluation:     Time spent with patient: 20 minutes    Follow up in 3 week(s)    Contact information provided for questions     Thank you,    Claiborne Rigg, RD LD

## 2021-12-05 NOTE — Patient Instructions
1. Increase self-awareness and accountability through self-monitoring.    - Follow up with RD for support   - journal food + workouts in your journal -- include time, amount, detail + mood + hunger level     2. Consume a minimum of 5 cups of fruits/vegetables daily.   Focus on getting at least half of foods as fruits and non-starchy vegetables. Whole grains, lean meats/eggs, some dairy, nuts and seeds made up the remainder of the diet. Foods both high in calories and low in nutritive quality are consumed rarely (baked goods, fried foods, high fat/processed meats, sweets).     - balanced snacks (pick 2-3 options to have for the week) -- see Snack Planning Tool   - include a fruit or vegetable with breakfast     3. Progress to meet recommendation for 150+ minutes of physical activity for general health, while increasing to 300+ minutes weekly for continued weight loss or increased success for weight maintenance.   - walking or elliptical 2-3x/week for 10-15 minutes     4. Increase activities of daily living and reduce sedentary time.    Step goal: 7,000 steps/day  - walk to mailbox, use bathroom farther away, take stairs, park in back of parking lot     5. Make modifications to environment to support healthy lifestyle changes.   - keep in mind the non scale victories

## 2022-02-11 ENCOUNTER — Encounter: Admit: 2022-02-11 | Discharge: 2022-02-11 | Payer: BC Managed Care – PPO

## 2022-02-19 ENCOUNTER — Encounter: Admit: 2022-02-19 | Discharge: 2022-02-19 | Payer: BC Managed Care – PPO

## 2022-10-13 ENCOUNTER — Encounter: Admit: 2022-10-13 | Discharge: 2022-10-13 | Payer: BC Managed Care – PPO

## 2022-10-13 DIAGNOSIS — E669 Obesity, unspecified: Secondary | ICD-10-CM

## 2022-10-13 NOTE — Progress Notes
Primary goal of visit: Initial nutrition counseling visit for Intensive Behavioral Therapy for Weight Management    Nutrition Assessment of Patient:    Ht Readings from Last 1 Encounters:   10/15/21 162.6 cm (5' 4)       Wt Readings from Last 5 Encounters:   10/13/22 84.5 kg (186 lb 3.2 oz)   12/05/21 82.5 kg (181 lb 14.4 oz)   11/14/21 83.9 kg (185 lb)   10/24/21 85.7 kg (189 lb)   10/15/21 86 kg (189 lb 9.6 oz)       BMI: Body mass index is 31.96 kg/m?Marland Kitchen  BMI Categories Adult: class I obese  Total Energy Expenditure (based on Mifflin equation x activity factor): 1425 x 1.2 = 1710 kcal  Total Calorie Prescription for weight loss: 1210 kcal  Needs to promote: weight loss and maintenance  6 months: 04/14/23    PMH:  Medical History:   Diagnosis Date    Anxiety     Asthma     DJD (degenerative joint disease)     GERD (gastroesophageal reflux disease)     Migraines     Uterine fibroid          Notes:     Was meeting with me. Last seen 10 months ago    Food intolerances/restrictions - some lactose intolerance (hard cheese, cottage chz in small amts)      Diet:      Meal 1 Snack Meal 2 Snack Meal 3 Snack   Time 8-9 am  12 pm       1 egg + whole grain bread + chz + avocado      Bowl - onions, eggs, sausage      Egg sandwich - English muffin + bacon + egg + chz      Raisin bran + banana  Gets frustrated with work & goes into [employer] has chips or candy  Leftovers  - today was chicken fried chicken + mashed potatoes + mac & cheese  (from a restaurant)  Usually has snack here  Leftovers      Might just have a protein shake      Makes a meal for the guys    Taco salad       Lately has been - licorice      Decaf green tea + 3/4 tsp honey      Drinks: water (has 20 oz tumbler, drinks 40-60 oz total), diet Dr. Reino Kent (2/day), grapefruit juice, green tea; etoh rare   Snacks: vegetables, chips, candy  Fruits/Vegetables: likes most of these   Meal Plan/Prep: trying to do meal planning; lives with husband + Dad who both have diabetes   - shop: self    - cook: she cooks a few times a week; does not deep fry  Eating Out: 2-3x/week   Weekends: less snacking   Tracking: just started [employer] but doesn't like it; started on My Fitness Pal     Activity:  Planned Exercise: started walking 2-3x/week for 30-60 minutes, light intensity  General Activity: sedentary (works from home)  Tracking: watch, avg 3000, hoping to get up to [employer]  Medical Restrictions: none  Physical Barriers/Ailments: back d/t disk - might start PT    **will discuss more at later visit**  Stress:  Emotional Eating:  Sleep:   Support: husband is on Ozempic & doesn't eat a lot  Barriers/Challenges:      Nutrition Diagnosis:  Overweight/obesity  Related to: excessive energy intake, food- and  nutrition-related knowledge deficit, physical inactivity  As evidenced by: Body mass index is 31.96 kg/m?., diet recall, pt interview, infrequent, low-duration physical activity, uncertainty regarding nutrition-related recommendations       Intervention/Plan:     1. Increase self-awareness and accountability through self-monitoring.    - keep logging on My Fitness Pal     2. Consume a minimum of 5 cups of fruits/vegetables daily.   Focus on getting at least half of foods as fruits and non-starchy vegetables. Whole grains, lean meats/eggs, some dairy, nuts and seeds made up the remainder of the diet. Foods both high in calories and low in nutritive quality are consumed rarely (baked goods, fried foods, high fat/processed meats, sweets).    - balanced snacks -- see Snack Planning Tool; get these ready to go when you get home from the store    3. Progress to meet recommendation for 150+ minutes of physical activity for general health, while increasing to 300+ minutes weekly for continued weight loss or increased success for weight maintenance.   - walking 2-3x/week for 30-60 minutes  - check out some videos in handout emailed    4. Increase activities of daily living and reduce sedentary time. Step goal: 5,000-7,000 steps/day    5. Make modifications to environment to support healthy lifestyle changes.   - dining out -- see handout & we will discuss this more at next visit  - only eat your snacks in the kitchen, not at your desk  - tips to slow down while eating - set fork/food down between bites, take smaller bites, chew more per bite (20-30 times), drink some water between bites      Nutrition Monitoring and Evaluation:     Time spent with patient: 40 minutes    Follow up in 1 month(s)    Contact information provided for questions     Thank you,    Claiborne Rigg, RD LD

## 2022-10-13 NOTE — Patient Instructions
1. Increase self-awareness and accountability through self-monitoring.    - keep logging on My Fitness Pal     2. Consume a minimum of 5 cups of fruits/vegetables daily.   Focus on getting at least half of foods as fruits and non-starchy vegetables. Whole grains, lean meats/eggs, some dairy, nuts and seeds made up the remainder of the diet. Foods both high in calories and low in nutritive quality are consumed rarely (baked goods, fried foods, high fat/processed meats, sweets).    - balanced snacks -- see Snack Planning Tool; get these ready to go when you get home from the store    3. Progress to meet recommendation for 150+ minutes of physical activity for general health, while increasing to 300+ minutes weekly for continued weight loss or increased success for weight maintenance.   - walking 2-3x/week for 30-60 minutes  - check out some videos in handout emailed    4. Increase activities of daily living and reduce sedentary time.    Step goal: 5,000-7,000 steps/day    5. Make modifications to environment to support healthy lifestyle changes.   - dining out -- see handout & we will discuss this more at next visit  - only eat your snacks in the kitchen, not at your desk  - tips to slow down while eating - set fork/food down between bites, take smaller bites, chew more per bite (20-30 times), drink some water between bites

## 2022-11-13 ENCOUNTER — Encounter: Admit: 2022-11-13 | Discharge: 2022-11-13 | Payer: BC Managed Care – PPO

## 2022-11-13 DIAGNOSIS — E669 Obesity, unspecified: Secondary | ICD-10-CM

## 2022-11-13 NOTE — Patient Instructions
1. Increase self-awareness and accountability through self-monitoring.    - keep logging on My Fitness Pal      2. Consume a minimum of 5 cups of fruits/vegetables daily.   Focus on getting at least half of foods as fruits and non-starchy vegetables. Whole grains, lean meats/eggs, some dairy, nuts and seeds made up the remainder of the diet. Foods both high in calories and low in nutritive quality are consumed rarely (baked goods, fried foods, high fat/processed meats, sweets).     - balanced snacks -- see Snack Planning Tool; get these ready to go when you get home from the store  - include a fruit or vegetable with most meals/snacks     3. Progress to meet recommendation for 150+ minutes of physical activity for general health, while increasing to 300+ minutes weekly for continued weight loss or increased success for weight maintenance.   - walking 2-3x/week for 30-60 minutes  - body weight videos 2-3x/week     4. Increase activities of daily living and reduce sedentary time.    Step goal: 6,000-7,000 steps/day  - walk to mailbox, use bathroom farther away, take stairs, park in back of parking lot     5. Make modifications to environment to support healthy lifestyle changes.   - for dining out - can split with someone, start with a side salad, box up half of meal  - only eat your snacks in the kitchen, not at your desk; try individual bags of chips / keep out of sight  - tips to slow down while eating - set fork/food down between bites, take smaller bites, chew more per bite (20-30 times), drink some water between bites

## 2022-11-13 NOTE — Progress Notes
Primary goal of visit: Follow-up nutrition counseling visit for Intensive Behavioral Therapy for Weight Management    Nutrition Assessment of Patient:    Ht Readings from Last 1 Encounters:   10/15/21 162.6 cm (5' 4)       Wt Readings from Last 5 Encounters:   11/13/22 85.4 kg (188 lb 3.2 oz)   10/13/22 84.5 kg (186 lb 3.2 oz)   12/05/21 82.5 kg (181 lb 14.4 oz)   11/14/21 83.9 kg (185 lb)   10/24/21 85.7 kg (189 lb)    *pt reported weight    Weight change: Lost 0 lbs since initial weight of 186 lbs on 10/13/22     Body mass index is 32.3 kg/m?Marland Kitchen  BMI Categories Adult: class I obese  Total Calorie Prescription: 1210 kcal   Needs to promote: weight loss and maintenance  6 months: 04/14/23    Notes:     Steps - 5-7k, some days hits 10k  PA - sporadic. Thinks she found some body weight videos she wants to do + some stretches that won't bother her DJD. Also has some weights she can use as well.     Snacks - still eating at desk d/t being busy but does feel like this will help her. When she is tired it is hard to make better decisions. Also doing nuts + fruits + cottage cheese + hummus + string cheese + tomatoes/other vegetables.  Fruits + veg - still working on these   BF - avocado toast; eggs    Dining out - more bar & grill places near them which doesn't have the greatest options available.       Intervention/Plan:     1. Increase self-awareness and accountability through self-monitoring.    - keep logging on My Fitness Pal      2. Consume a minimum of 5 cups of fruits/vegetables daily.   Focus on getting at least half of foods as fruits and non-starchy vegetables. Whole grains, lean meats/eggs, some dairy, nuts and seeds made up the remainder of the diet. Foods both high in calories and low in nutritive quality are consumed rarely (baked goods, fried foods, high fat/processed meats, sweets).     - balanced snacks -- see Snack Planning Tool; get these ready to go when you get home from the store  - include a fruit or vegetable with most meals/snacks     3. Progress to meet recommendation for 150+ minutes of physical activity for general health, while increasing to 300+ minutes weekly for continued weight loss or increased success for weight maintenance.   - walking 2-3x/week for 30-60 minutes  - body weight videos 2-3x/week     4. Increase activities of daily living and reduce sedentary time.    Step goal: 6,000-7,000 steps/day  - walk to mailbox, use bathroom farther away, take stairs, park in back of parking lot     5. Make modifications to environment to support healthy lifestyle changes.   - for dining out - can split with someone, start with a side salad, box up half of meal  - only eat your snacks in the kitchen, not at your desk; try individual bags of chips / keep out of sight  - tips to slow down while eating - set fork/food down between bites, take smaller bites, chew more per bite (20-30 times), drink some water between bites      Nutrition Monitoring and Evaluation:     Time spent with patient: 20 minutes  Follow up in 1 month(s)    Contact information provided for questions     Thank you,    Claiborne Rigg, RD LD

## 2022-12-08 ENCOUNTER — Encounter: Admit: 2022-12-08 | Discharge: 2022-12-08 | Payer: BC Managed Care – PPO

## 2022-12-22 ENCOUNTER — Encounter: Admit: 2022-12-22 | Discharge: 2022-12-22 | Payer: BC Managed Care – PPO

## 2023-01-01 NOTE — Progress Notes
This patient's plan appears to cover AOMs. Dorothy Spark, MA

## 2023-01-02 ENCOUNTER — Encounter: Admit: 2023-01-02 | Discharge: 2023-01-02 | Payer: BC Managed Care – PPO

## 2023-01-02 DIAGNOSIS — E669 Obesity, unspecified: Secondary | ICD-10-CM

## 2023-01-02 DIAGNOSIS — J45909 Unspecified asthma, uncomplicated: Secondary | ICD-10-CM

## 2023-01-02 DIAGNOSIS — D259 Leiomyoma of uterus, unspecified: Secondary | ICD-10-CM

## 2023-01-02 DIAGNOSIS — G43909 Migraine, unspecified, not intractable, without status migrainosus: Secondary | ICD-10-CM

## 2023-01-02 DIAGNOSIS — F419 Anxiety disorder, unspecified: Secondary | ICD-10-CM

## 2023-01-02 DIAGNOSIS — K760 Fatty (change of) liver, not elsewhere classified: Secondary | ICD-10-CM

## 2023-01-02 DIAGNOSIS — E039 Hypothyroidism, unspecified: Secondary | ICD-10-CM

## 2023-01-02 DIAGNOSIS — K219 Gastro-esophageal reflux disease without esophagitis: Secondary | ICD-10-CM

## 2023-01-02 DIAGNOSIS — M1990 Unspecified osteoarthritis, unspecified site: Secondary | ICD-10-CM

## 2023-01-02 MED ORDER — TOPIRAMATE 25 MG PO TAB
25 mg | ORAL_TABLET | Freq: Two times a day (BID) | ORAL | 3 refills | Status: AC
Start: 2023-01-02 — End: ?

## 2023-01-02 MED ORDER — METFORMIN 500 MG PO TB24
1500 mg | ORAL_TABLET | Freq: Every day | ORAL | 3 refills | Status: AC
Start: 2023-01-02 — End: ?

## 2023-01-02 NOTE — Progress Notes
Primary goal of visit: Follow-up nutrition counseling visit for Intensive Behavioral Therapy for Weight Management    Nutrition Assessment of Patient:    Ht Readings from Last 1 Encounters:   01/02/23 162.6 cm (5' 4)       Wt Readings from Last 5 Encounters:   01/02/23 84.4 kg (186 lb)   01/02/23 84.5 kg (186 lb 3.2 oz)   12/08/22 85 kg (187 lb 6.4 oz)   11/13/22 85.4 kg (188 lb 3.2 oz)   10/13/22 84.5 kg (186 lb 3.2 oz)       Weight change: Lost 0 lbs since initial weight of 186 lbs on 10/13/22      Body mass index is 32.3 kg/m?Marland Kitchen  BMI Categories Adult: class I obese  Total Calorie Prescription: 1210 kcal   Needs to promote: weight loss and maintenance  6 months: 04/14/23    Notes:     Starting some new medication with Dr. Mayford Knife. Metformin + topamax.     Snacking still challenging. Especially chex mix.     Traveled last week - ate part of meal, more seafood. Got some leisurely movement/walking in here. Some days more sedentary.    Hurt ankle/knee.     Just joined [employer]. Has done this before & liked it. Likes the support system aspect.   MFP logging not greatly helpful right now.    She turned down dining out once this week.   Wed - ate out & had hamburger + no bun + salad + zucchini fries    Intervention/Plan:     1. Increase self-awareness and accountability through self-monitoring.    - log on My Fitness Pal intermittently to see where you are at with calories + protein  - okay to use [employer] for points + support system however keep in mind zero point foods are not zero calorie foods     2. Consume a minimum of 5 cups of fruits/vegetables daily.   Focus on getting at least half of foods as fruits and non-starchy vegetables. Whole grains, lean meats/eggs, some dairy, nuts and seeds made up the remainder of the diet. Foods both high in calories and low in nutritive quality are consumed rarely (baked goods, fried foods, high fat/processed meats, sweets).     - balanced snacks -- see Snack Planning Tool; get these ready to go when you get home from the store  - include a fruit or vegetable with most meals/snacks     3. Progress to meet recommendation for 150+ minutes of physical activity for general health, while increasing to 300+ minutes weekly for continued weight loss or increased success for weight maintenance.   - walking 2-3x/week for 30-60 minutes  - body weight videos 2-3x/week  - think of this as a high priority!  - plan for the week ahead  - Online workouts: [employer], [place], [place], [place], [place], [place], [place] with [place], [employer] on Netflix  - Seated exercise videos: Earney Mallet, [facility], [employer], [email or web address]     4. Increase activities of daily living and reduce sedentary time.    Step goal: 6,000-7,000 steps/day  - walk to mailbox, use bathroom farther away, take stairs, park in back of parking lot     5. Make modifications to environment to support healthy lifestyle changes.   - for dining out - can split with someone, start with a side salad, box up half of meal  - only eat your snacks in  the kitchen, not at your desk; try individual bags of chips / keep out of sight  - tips to slow down while eating - set fork/food down between bites, take smaller bites, chew more per bite (20-30 times), drink some water between bites  - practice saying no, send the food home with others, keep in freezer  - portion appetizer onto plate & stick to 1 plate only  - keep chex mix + other tempting snacks out of the house      Nutrition Monitoring and Evaluation:     Time spent with patient: 20 minutes    Follow up in 1 month(s)    Contact information provided for questions     Thank you,    Claiborne Rigg, RD LD

## 2023-01-02 NOTE — Progress Notes
[facility]  Follow Up Visit    Date of Service: 01/02/2023  PCP: Rockwell [place]  DOB: May 08, 1966  MRN#: [id number]    Taylea Lathon is a 57 y.o. female.        History of Present Illness  Silvia Yong is a 57 y.o. female with  has a past medical history of Anxiety, Asthma, DJD (degenerative joint disease), GERD (gastroesophageal reflux disease), Migraines, and Uterine fibroid. presenting for follow up discussion of obesity and weight management.    Nutrition: working with Jae Dire.  Feels good with her nutrition but often has cravings that drive her nutrition.  The snacking is generally in the morning.      Activity: tired to walk more.  Working with Jae Dire.     Medication: Tried the ozempic for 2-3 months.  The cost went up to about $235 per month.  She has increased to 0.5mg  weekly.  She found that this was not that helpful for her because hunger is not a significant driver for her.  Her PCP tried to use contrave for her but this was not covered until she tried wegovy and secondary to cost she opted not to do this.    Tried NOOM, did this for about a month.  When she tracks in myfitness this tends to help with weight loss.  When she tracks her macros she does ok but when she stops and finds that she will get things like candy bars.      She joined [employer] this week.        She goes out to eat a lot because they often are meeting family.    She has a hard time with portion sizes, she also snacks quite a bit through the day.      Review of Systems   Constitution: Positive for weight stable.  Gastrointestinal: Positive for intermittent upset stomach (associated with some foods)    Objective:     Physical Exam  Constitutional: Well developed, well nourished, no distress   HENT: Normocephalic, atraumatic  Cardiac: RRR, no murmur  Respiratory: Normal effort, clear to auscultation, no respiratory distress  Neurologic: Alert  Psych: mood /affect normal. Behavior normal. Thought content normal. Judgement normal.    Medications-   albuterol sulfate (PROAIR HFA) 90 mcg/actuation HFA aerosol inhaler Inhale two puffs by mouth into the lungs every 6 hours as needed for Wheezing or Shortness of Breath. Shake well before use.    atorvastatin (LIPITOR) 10 mg tablet Take one tablet by mouth daily.    azelastine (ASTELIN) 137 mcg (0.1 %) nasal spray Apply one spray to each nostril as directed twice daily.    cetirizine (ZYRTEC) 10 mg tablet Take one tablet by mouth every morning.    cyclobenzaprine (FLEXERIL) 10 mg tablet Take one tablet by mouth at bedtime as needed for Muscle Cramps.    furosemide (LASIX) 20 mg tablet Take one tablet by mouth every morning.    levothyroxine (SYNTHROID) 75 mcg tablet Take one tablet by mouth daily 30 minutes before breakfast.    omeprazole DR(+) (PRILOSEC) 20 mg capsule Take one capsule by mouth daily before breakfast.    SUMAtriptan succinate (IMITREX) 50 mg tablet Take one tablet by mouth as Needed for Migraine symptoms. Dose may be repeated in 2 hours if needed. Max of 2 tablets in 24 hours.    triamterene-hydrochlorothiazide (MAXZIDE) 75-50 mg tablet Take one tablet by mouth daily.     Recent Labs:  Basic Metabolic  Profile    Lab Results   Component Value Date/Time    NA 142 07/26/2021 11:22 AM    K 3.5 07/26/2021 11:22 AM    CA 9.9 07/26/2021 11:22 AM    CL 101 07/26/2021 11:22 AM    CO2 29 07/26/2021 11:22 AM    GAP 12 07/26/2021 11:22 AM    Lab Results   Component Value Date/Time    BUN 17 07/26/2021 11:22 AM    CR 0.97 07/26/2021 11:22 AM    GLU 98 07/26/2021 11:22 AM        Hepatic Function    Lab Results   Component Value Date/Time    ALBUMIN 4.6 07/26/2021 11:22 AM    TOTPROT 7.2 07/26/2021 11:22 AM    ALKPHOS 111 (H) 07/26/2021 11:22 AM    Lab Results   Component Value Date/Time    AST 41 (H) 07/26/2021 11:22 AM    ALT 65 (H) 07/26/2021 11:22 AM    TOTBILI 1.1 07/26/2021 11:22 AM        TSH   No results found for: TSH   Lab Draw:  Lab Results   Component Value Date/Time    HGBA1C 5.0 04/24/2021 11:05 AM     POC:  No results found for: A1C   No results found for: CHOL, TRIG, HDL, LDL, VLDL, NONHDLCHOL, CHOLHDLC   Wt Readings from Last 5 Encounters:   01/02/23 84.5 kg (186 lb 3.2 oz)   12/08/22 85 kg (187 lb 6.4 oz)   11/13/22 85.4 kg (188 lb 3.2 oz)   10/13/22 84.5 kg (186 lb 3.2 oz)   12/05/21 82.5 kg (181 lb 14.4 oz)     BP Readings from Last 3 Encounters:   01/02/23 132/84   10/15/21 123/87   07/26/21 129/84     Vitals:    01/02/23 0824   BP: 132/84   BP Source: Arm, Left Upper   Pulse: 73   Resp: 18   PainSc: Zero   Weight: 84.5 kg (186 lb 3.2 oz)   Height: 162.6 cm (5' 4)     Body mass index is 31.96 kg/m?Marland Kitchen       Assessment and Plan:  Obesity-   Initial weight & BMI: 189, 32.54 (10/15/21)   Today's weight & BMI:   Wt Readings from Last 1 Encounters:   01/02/23 84.5 kg (186 lb 3.2 oz)   Body mass index is 31.96 kg/m?Marland Kitchen  Total weight loss to date:  3 #    Treatment:  Plan/ level of monitoring: Nutrition Counseling and Pharmacotherapy  Current Medication -   Start metformin, use 1 pill at night after your evening meal.  Increase this by 1 pill weekly to a goal of 3 tabs after supper.    Start topamax in the morning daily and after 1 week increase this to morning and evening dosing.  This will help you to feel more controlled  3. Phentermine is another medication that can add into this regimen.  Start these first and we'll follow up on this possibility in 2 months.    Failed medication -   Ozempic - expensive and she didn't feel like this was working well  Contrave - suggested by PCP, not covered    Discussion of safety/side effects - Discussed GI SE, paresthesias, renal stones, brain fog    Obesity Management goals: Was told by the hepatology team that she needs to lose 10% TBW.  Would like to find a system that does not exacerbate  her should and knee pain.  Would like to get off medications, hesitant to add medications to aid in weight loss though willing to try for a time.    Further obesity management recommendations:   Annual screen for a assessment of Type II diabetes, HTN, Hyperlipidemia, Depression, OSA, NAFLD, Vitamin D deficiency and renal disease.   Given the association of obesity with cancer, all age appropriate cancer screenings should be UTD  Medication Review: If possible medication that promote weight gain should be avoided and medication that are weight-neutral or promote weight loss should be considered.     Comorbid Conditions:  HTN  HLD  Hypothyroidism  MAFLD/ NASH  Asthma    Total Time Today was 40 minutes in the following activities: Preparing to see the patient, Obtaining and/or reviewing separately obtained history, Performing a medically appropriate examination and/or evaluation, Counseling and educating the patient/family/caregiver, Ordering medications, tests, or procedures, and Documenting clinical information in the electronic or other health record      Future Appointments   Date Time Provider Department Center   01/02/2023  9:20 AM Liam Rogers, RD [facility]

## 2023-01-13 ENCOUNTER — Encounter: Admit: 2023-01-13 | Discharge: 2023-01-13 | Payer: BC Managed Care – PPO

## 2023-01-30 ENCOUNTER — Ambulatory Visit: Admit: 2023-01-30 | Discharge: 2023-01-30 | Payer: BC Managed Care – PPO

## 2023-01-30 DIAGNOSIS — E669 Obesity, unspecified: Secondary | ICD-10-CM

## 2023-01-30 NOTE — Progress Notes
Primary goal of visit: Follow-up nutrition counseling visit for Intensive Behavioral Therapy for Weight Management    Nutrition Assessment of Patient:    Ht Readings from Last 1 Encounters:   01/02/23 162.6 cm (5' 4)       Wt Readings from Last 5 Encounters:   01/30/23 82 kg (180 lb 11.2 oz)   01/02/23 84.4 kg (186 lb)   01/02/23 84.5 kg (186 lb 3.2 oz)   12/08/22 85 kg (187 lb 6.4 oz)   11/13/22 85.4 kg (188 lb 3.2 oz)    *pt reported weight    Weight change: Lost 6 lbs since initial weight of 186 lbs on 10/13/22     Body mass index is 31.02 kg/m?Marland Kitchen  BMI Categories Adult: class I obese  Total Calorie Prescription: 1210 kcal   Needs to promote: weight loss and maintenance  6 months: 04/14/23    Notes:     Better with dining out.     She started metformin + topamax & has not been eating as much.     Sometimes snacks instead of having a meal. Then when meal time comes she isn't hungry.   Doesn't always feel satisfied after meals.  Snacking on chips + popcorn. Takes them down to her desk.   Eats when she is frustrated. Not always eats when she is hungry.     Less walking.     She is interested in our [facility] workshop next month.     Meal 1 Snack Meal 2 Snack Meal 3 Snack   Time          Raisin bran yesterday      Today: [place] + whole grain toast + 1/2 avocado  Rotisserie chicken + sweet corn + orange [place] shake + popcorn  Corn bread + sausage        Intervention/Plan:     1. Increase self-awareness and accountability through self-monitoring.    - log on My Fitness Pal intermittently to see where you are at with calories + protein  - okay to use [facility] for points + support system however keep in mind zero point foods are not zero calorie foods  - consider IBH workshop next month     2. Consume a minimum of 5 cups of fruits/vegetables daily.   Focus on getting at least half of foods as fruits and non-starchy vegetables. Whole grains, lean meats/eggs, some dairy, nuts and seeds made up the remainder of the diet. Foods both high in calories and low in nutritive quality are consumed rarely (baked goods, fried foods, high fat/processed meats, sweets).     - balanced snacks -- see Snack Planning Tool; get these ready to go when you get home from the store  - include a fruit or vegetable with most meals/snacks     3. Progress to meet recommendation for 150+ minutes of physical activity for general health, while increasing to 300+ minutes weekly for continued weight loss or increased success for weight maintenance.   - walking 2-3x/week for 30-60 minutes  - body weight videos 2-3x/week  - think of this as a high priority!  - plan for the week ahead  - Online workouts: [employer], [place], [place], [place], [place], [place], [place] with [place], [employer] and [employer] on Netflix  - Seated exercise videos: Earney Mallet, [facility], [employer], [email or web address]     4. Increase activities of daily living and reduce sedentary time.    Step goal:  6,000-7,000 steps/day  - walk to mailbox, use bathroom farther away, take stairs, park in back of parking lot     5. Make modifications to environment to support healthy lifestyle changes.   - for dining out - can split with someone, start with a side salad, box up half of meal  - only eat your snacks in the kitchen, not at your desk; try individual bags of chips / keep out of sight  - tips to slow down while eating - set fork/food down between bites, take smaller bites, chew more per bite (20-30 times), drink some water between bites  - practice saying no, send the food home with others, keep in freezer  - portion appetizer onto plate & stick to 1 plate only  - keep chex mix + other tempting snacks out of the house  - manage your frustration with another activity before directly reaching for food         Nutrition Monitoring and Evaluation:     Time spent with patient: 20 minutes    Follow up in 1 month(s)    Contact information provided for questions     Thank you,    Claiborne Rigg, RD LD

## 2023-02-02 ENCOUNTER — Encounter: Admit: 2023-02-02 | Discharge: 2023-02-02 | Payer: BC Managed Care – PPO

## 2023-03-06 ENCOUNTER — Encounter: Admit: 2023-03-06 | Discharge: 2023-03-06 | Payer: BC Managed Care – PPO

## 2023-03-06 ENCOUNTER — Ambulatory Visit: Admit: 2023-03-06 | Discharge: 2023-03-06 | Payer: BC Managed Care – PPO

## 2023-03-06 DIAGNOSIS — E669 Obesity, unspecified: Secondary | ICD-10-CM

## 2023-03-06 DIAGNOSIS — D259 Leiomyoma of uterus, unspecified: Secondary | ICD-10-CM

## 2023-03-06 DIAGNOSIS — K219 Gastro-esophageal reflux disease without esophagitis: Secondary | ICD-10-CM

## 2023-03-06 DIAGNOSIS — G43909 Migraine, unspecified, not intractable, without status migrainosus: Secondary | ICD-10-CM

## 2023-03-06 DIAGNOSIS — M1990 Unspecified osteoarthritis, unspecified site: Secondary | ICD-10-CM

## 2023-03-06 DIAGNOSIS — F419 Anxiety disorder, unspecified: Secondary | ICD-10-CM

## 2023-03-06 DIAGNOSIS — E039 Hypothyroidism, unspecified: Secondary | ICD-10-CM

## 2023-03-06 DIAGNOSIS — K760 Fatty (change of) liver, not elsewhere classified: Secondary | ICD-10-CM

## 2023-03-06 DIAGNOSIS — J45909 Unspecified asthma, uncomplicated: Secondary | ICD-10-CM

## 2023-03-06 NOTE — Progress Notes
Primary goal of visit: Follow-up nutrition counseling visit for Intensive Behavioral Therapy for Weight Management    Nutrition Assessment of Patient:    Ht Readings from Last 1 Encounters:   01/02/23 162.6 cm (5' 4)       Wt Readings from Last 5 Encounters:   03/06/23 81.2 kg (179 lb)   01/30/23 82 kg (180 lb 11.2 oz)   01/02/23 84.4 kg (186 lb)   01/02/23 84.5 kg (186 lb 3.2 oz)   12/08/22 85 kg (187 lb 6.4 oz)    *pt reported weight    Weight change: Lost 7 lbs since initial weight of 186 lbs on 10/13/22     Body mass index is 30.73 kg/m?Marland Kitchen  BMI Categories Adult: class I obese  Total Calorie Prescription: 1210 kcal   Needs to promote: weight loss and maintenance  6 months: 04/14/23    Notes:     Her stomach has felt queasy lately.     More candy lately. Less protein intake.  Both she & her dad bring candy into the house. Got some for her grandkids. Has 4-5 pieces. Also gets candy bars when at the store.    IBH workshop - planning to have the recorded sessions sent to her    PA - walks here and there but not regularly      Intervention/Plan:     1. Increase self-awareness and accountability through self-monitoring.    - log on My Fitness Pal intermittently to see where you are at with calories + protein  - okay to use [facility] for points + support system however keep in mind zero point foods are not zero calorie foods  - consider IBH workshop next month     2. Consume a minimum of 5 cups of fruits/vegetables daily.   Focus on getting at least half of foods as fruits and non-starchy vegetables. Whole grains, lean meats/eggs, some dairy, nuts and seeds made up the remainder of the diet. Foods both high in calories and low in nutritive quality are consumed rarely (baked goods, fried foods, high fat/processed meats, sweets).     - balanced snacks -- see Snack Planning Tool; get these ready to go when you get home from the store  - include a fruit or vegetable with most meals/snacks     3. Progress to meet recommendation for 150+ minutes of physical activity for general health, while increasing to 300+ minutes weekly for continued weight loss or increased success for weight maintenance.   - walking 2-3x/week for 30-60 minutes  - body weight videos 2-3x/week  - think of this as a high priority!  - plan for the week ahead  - Online workouts: [employer], [place], [place], [place], [place], [place], [place] with [place], [employer] on Netflix  - Seated exercise videos: Earney Mallet, [facility], [employer], [email or web address]     4. Increase activities of daily living and reduce sedentary time.    Step goal: 6,000-7,000 steps/day  - walk to mailbox, use bathroom farther away, take stairs, park in back of parking lot     5. Make modifications to environment to support healthy lifestyle changes.   - for dining out - can split with someone, start with a side salad, box up half of meal  - only eat your snacks in the kitchen, not at your desk; try individual bags of chips / keep out of sight  - tips to slow down while eating - set  fork/food down between bites, take smaller bites, chew more per bite (20-30 times), drink some water between bites  - practice saying no, send the food home with others, keep in freezer  - portion appetizer onto plate & stick to 1 plate only  - keep chex mix + other tempting snacks out of the house  - manage your frustration with another activity before directly reaching for food      Nutrition Monitoring and Evaluation:     Time spent with patient: 15 minutes    Follow up in 3 month(s)    Contact information provided for questions     Thank you,    Claiborne Rigg, RD LD

## 2023-03-07 ENCOUNTER — Encounter: Admit: 2023-03-07 | Discharge: 2023-03-07 | Payer: BC Managed Care – PPO

## 2023-03-07 DIAGNOSIS — M1990 Unspecified osteoarthritis, unspecified site: Secondary | ICD-10-CM

## 2023-03-07 DIAGNOSIS — G43909 Migraine, unspecified, not intractable, without status migrainosus: Secondary | ICD-10-CM

## 2023-03-07 DIAGNOSIS — K219 Gastro-esophageal reflux disease without esophagitis: Secondary | ICD-10-CM

## 2023-03-07 DIAGNOSIS — J45909 Unspecified asthma, uncomplicated: Secondary | ICD-10-CM

## 2023-03-07 DIAGNOSIS — D259 Leiomyoma of uterus, unspecified: Secondary | ICD-10-CM

## 2023-03-07 DIAGNOSIS — F419 Anxiety disorder, unspecified: Secondary | ICD-10-CM

## 2023-05-29 ENCOUNTER — Encounter: Admit: 2023-05-29 | Discharge: 2023-05-29 | Payer: BC Managed Care – PPO

## 2023-05-29 ENCOUNTER — Ambulatory Visit: Admit: 2023-05-29 | Discharge: 2023-05-29 | Payer: BC Managed Care – PPO

## 2023-05-29 DIAGNOSIS — E66811 Class 1 obesity with serious comorbidity and body mass index (BMI) of 31.0 to 31.9 in adult, unspecified obesity type: Secondary | ICD-10-CM

## 2023-05-29 DIAGNOSIS — E039 Hypothyroidism, unspecified: Secondary | ICD-10-CM

## 2023-05-29 DIAGNOSIS — K760 Fatty (change of) liver, not elsewhere classified: Secondary | ICD-10-CM

## 2023-05-29 NOTE — Progress Notes
[facility]  Follow Up Visit    Date of Service: 05/29/2023  PCP: Rockwell [place]  DOB: Jun 25, 1965  MRN#: [id number]    Alicia Padilla is a 58 y.o. female.        History of Present Illness  Alicia Padilla is a 57 y.o. female with  has a past medical history of Anxiety, Asthma, DJD (degenerative joint disease), GERD (gastroesophageal reflux disease), Migraines, and Uterine fibroid. presenting for follow up discussion of obesity and weight management.    Looking forward to the holidays.  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      Medication - she is feeling well but this didn't control her appetite.  She is now taking metformin 3 tabs at night.  Taking topamax 1 pill in the morning and 1/2 pill at night.  She is increasing the evening dose to a full tab and is tolerating this well.  Notices appetite suppression.      She continues to snack on things in between meals and then not feel sure if she should have a meal or not.  Not eating very good meals at times.      Review of Systems   Constitution: Positive for weight stable.  Gastrointestinal: Negative for GI upset    Objective:     Physical Exam  Constitutional: Well developed, well nourished, no distress   HENT: Normocephalic, atraumatic  Respiratory: Normal effort, no respiratory distress  Neurologic: Alert  Psych: mood /affect normal. Behavior normal. Thought content normal. Judgement normal.    Medications-   albuterol sulfate (PROAIR HFA) 90 mcg/actuation HFA aerosol inhaler Inhale two puffs by mouth into the lungs every 6 hours as needed for Wheezing or Shortness of Breath. Shake well before use.    atorvastatin (LIPITOR) 10 mg tablet Take one tablet by mouth daily.    azelastine (ASTELIN) 137 mcg (0.1 %) nasal spray Apply one spray to each nostril as directed twice daily.    cetirizine (ZYRTEC) 10 mg tablet Take one tablet by mouth every morning.    cyclobenzaprine (FLEXERIL) 10 mg tablet Take one tablet by mouth at bedtime as needed for Muscle Cramps.    furosemide (LASIX) 20 mg tablet Take one tablet by mouth every morning.    levothyroxine (SYNTHROID) 75 mcg tablet Take one tablet by mouth daily 30 minutes before breakfast.    metFORMIN-XR (GLUCOPHAGE XR) 500 mg extended release tablet Take three tablets by mouth daily with dinner.    omeprazole DR(+) (PRILOSEC) 20 mg capsule Take one capsule by mouth daily before breakfast.    SUMAtriptan succinate (IMITREX) 50 mg tablet Take one tablet by mouth as Needed for Migraine symptoms. Dose may be repeated in 2 hours if needed. Max of 2 tablets in 24 hours.    topiramate (TOPAMAX) 25 mg tablet Take one tablet by mouth every 12 hours.    triamterene-hydrochlorothiazide (MAXZIDE) 75-50 mg tablet Take one tablet by mouth daily.     Recent Labs:  Basic Metabolic Profile    Lab Results   Component Value Date/Time    NA 142 07/26/2021 11:22 AM    K 3.5 07/26/2021 11:22 AM    CA 9.9 07/26/2021 11:22 AM    CL 101 07/26/2021 11:22 AM    CO2 29 07/26/2021 11:22 AM    GAP 12 07/26/2021 11:22 AM    Lab Results   Component Value Date/Time    BUN 17 07/26/2021 11:22 AM  CR 0.97 07/26/2021 11:22 AM    GLU 98 07/26/2021 11:22 AM        Hepatic Function    Lab Results   Component Value Date/Time    ALBUMIN 4.6 07/26/2021 11:22 AM    TOTPROT 7.2 07/26/2021 11:22 AM    ALKPHOS 111 (H) 07/26/2021 11:22 AM    Lab Results   Component Value Date/Time    AST 41 (H) 07/26/2021 11:22 AM    ALT 65 (H) 07/26/2021 11:22 AM    TOTBILI 1.1 07/26/2021 11:22 AM        TSH   No results found for: TSH   Lab Draw:  Lab Results   Component Value Date/Time    HGBA1C 5.0 04/24/2021 11:05 AM     POC:  No results found for: A1C   No results found for: CHOL, TRIG, HDL, LDL, VLDL, NONHDLCHOL, CHOLHDLC   Wt Readings from Last 5 Encounters:   03/06/23 81.2 kg (179 lb)   01/30/23 82 kg (180 lb 11.2 oz)   01/02/23 84.4 kg (186 lb)   01/02/23 84.5 kg (186 lb 3.2 oz)   12/08/22 85 kg (187 lb 6.4 oz) BP Readings from Last 3 Encounters:   01/02/23 132/84   10/15/21 123/87   07/26/21 129/84     There were no vitals filed for this visit.    There is no height or weight on file to calculate BMI.       Assessment and Plan:  Obesity-   Initial weight & BMI: 189, 32.54 (10/15/21)   Today's weight & BMI: 178  Total weight loss to date:  11 #, 5.3% TBW (-1 pound from last visit)    Treatment:  Plan/ level of monitoring: Nutrition Counseling and Pharmacotherapy  Nutrition recs:  In order to control cravings, we first need to ensure that you are getting what you need for meals  - Protein.  Aim for 30mg   - Fruit of veggie with each meal/ snack - this get you the fiber that you need    Current Medication -   Continue metformin 3 tab daily  Continue to work toward increasing topamax to 1 tab BID.  Currently 1 tab in am, 1/2 tab in PM    Failed medication -   Ozempic - expensive and she didn't feel like this was working well  Contrave - suggested by PCP, not covered    Discussion of safety/side effects - Discussed GI SE, paresthesias, renal stones, brain fog    Obesity Management goals: Was told by the hepatology team that she needs to lose 10% TBW.  Would like to find a system that does not exacerbate her should and knee pain.  Would like to get off medications, hesitant to add medications to aid in weight loss though willing to try for a time.    Further obesity management recommendations:   Annual screen for a assessment of Type II diabetes, HTN, Hyperlipidemia, Depression, OSA, NAFLD, Vitamin D deficiency and renal disease.   Given the association of obesity with cancer, all age appropriate cancer screenings should be UTD  Medication Review: If possible medication that promote weight gain should be avoided and medication that are weight-neutral or promote weight loss should be considered.     Comorbid Conditions:  HTN  HLD  Hypothyroidism  MAFLD/ NASH  Asthma    Total Time Today was 30 minutes in the following activities: Preparing to see the patient, Obtaining and/or reviewing separately obtained history, Performing a medically  appropriate examination and/or evaluation, Counseling and educating the patient/family/caregiver, Ordering medications, tests, or procedures, and Documenting clinical information in the electronic or other health record      Future Appointments   Date Time Provider Department Center   06/05/2023 10:00 AM Liam Rogers, RD [facility]

## 2023-06-05 ENCOUNTER — Ambulatory Visit: Admit: 2023-06-05 | Discharge: 2023-06-05 | Payer: BC Managed Care – PPO

## 2023-06-05 DIAGNOSIS — E66811 Obesity, Class I, BMI 30-34.9: Secondary | ICD-10-CM

## 2023-06-05 NOTE — Progress Notes
Primary goal of visit: Follow-up nutrition counseling visit for Intensive Behavioral Therapy for Weight Management    Nutrition Assessment of Patient:    Ht Readings from Last 1 Encounters:   01/02/23 162.6 cm (5' 4)       Wt Readings from Last 5 Encounters:   06/05/23 80.5 kg (177 lb 8 oz)   03/06/23 81.2 kg (179 lb)   01/30/23 82 kg (180 lb 11.2 oz)   01/02/23 84.4 kg (186 lb)   01/02/23 84.5 kg (186 lb 3.2 oz)    *pt reported weight    Weight change: Lost 9 lbs since initial weight of 186 lbs on 10/13/22     Body mass index is 30.47 kg/m?Marland Kitchen  BMI Categories Adult: class I obese  Total Calorie Prescription: 1210 kcal   Needs to promote: weight loss and maintenance  6 months: 04/14/23      Notes:     Feels she hasn't been eating a lot but has not been choosing the best food options. More carbs, less protein.      Since seeing Dr. Mayford Knife she has been emphasizing protein. Using protein shakes as needed. Makes a BF sandwich that is about 18 g protein. This fills her up.     She has been snacking when she is not hungry.     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 Will likely have a lot of leftovers. Tries to send these home. Having soups.       Intervention/Plan:     1. Increase self-awareness and accountability through self-monitoring.    - log on My Fitness Pal intermittently to see where you are at with calories + protein  - okay to use [employer] for points + support system however keep in mind zero point foods are not zero calorie foods     2. Consume a minimum of 5 cups of fruits/vegetables daily.   Focus on getting at least half of foods as fruits and non-starchy vegetables. Whole grains, lean meats/eggs, some dairy, nuts and seeds made up the remainder of the diet. Foods both high in calories and low in nutritive quality are consumed rarely (baked goods, fried foods, high fat/processed meats, sweets).    Specifically discussed today:   - aim for 30 grams protein per meal -- see handouts emailed for examples of this  - for Christmas - send leftovers home with others, freeze leftover soups, make a plate of appetizers + physically move away from this food, freeze goodies and/or have small amount with meal/snack     3. Progress to meet recommendation for 150+ minutes of physical activity for general health, while increasing to 300+ minutes weekly for continued weight loss or increased success for weight maintenance.   - walking 2-3x/week for 30-60 minutes  - body weight videos 2-3x/week  - think of this as a high priority!  - plan for the week ahead  - Online workouts: [employer], [place], [place], [place], [place], [place], [place] with [place], [employer] on Netflix  - Seated exercise videos: Earney Mallet, [employer], [employer], [email or web address]     4. Increase activities of daily living and reduce sedentary time.    Step goal: 6,000-7,000 steps/day  - walk to mailbox, use bathroom farther away, take stairs, park in back of parking lot     5. Make modifications to environment to support healthy lifestyle changes.          Nutrition Monitoring and Evaluation:     Time spent  with patient: 20 minutes    Follow up in 3 month(s)    Contact information provided for questions     Thank you,    Claiborne Rigg, RD LD

## 2023-06-05 NOTE — Patient Instructions
Specifically discussed today:   - aim for 30 grams protein per meal -- see handouts emailed for examples of this  - for Christmas - send leftovers home with others, freeze leftover soups, make a plate of appetizers + physically move away from this food, freeze goodies and/or have small amount with meal/snack

## 2023-09-04 ENCOUNTER — Encounter: Admit: 2023-09-04 | Discharge: 2023-09-04 | Payer: BC Managed Care – PPO

## 2023-09-24 ENCOUNTER — Encounter: Admit: 2023-09-24 | Discharge: 2023-09-24
